# Patient Record
Sex: Male | Born: 1965 | Race: White | Hispanic: No | Marital: Married | Smoking: Never smoker
Health system: Southern US, Community
[De-identification: ages and names within clinical notes are randomized; demographics above are authoritative.]

## PROBLEM LIST (undated history)

## (undated) DIAGNOSIS — K5790 Diverticulosis of intestine, part unspecified, without perforation or abscess without bleeding: Secondary | ICD-10-CM

## (undated) DIAGNOSIS — K760 Fatty (change of) liver, not elsewhere classified: Secondary | ICD-10-CM

## (undated) DIAGNOSIS — F909 Attention-deficit hyperactivity disorder, unspecified type: Secondary | ICD-10-CM

## (undated) DIAGNOSIS — Z8619 Personal history of other infectious and parasitic diseases: Secondary | ICD-10-CM

## (undated) DIAGNOSIS — F419 Anxiety disorder, unspecified: Secondary | ICD-10-CM

## (undated) DIAGNOSIS — K219 Gastro-esophageal reflux disease without esophagitis: Secondary | ICD-10-CM

## (undated) DIAGNOSIS — S0532XA Ocular laceration without prolapse or loss of intraocular tissue, left eye, initial encounter: Secondary | ICD-10-CM

## (undated) DIAGNOSIS — E039 Hypothyroidism, unspecified: Secondary | ICD-10-CM

## (undated) HISTORY — PX: FL BARIUM SWALLOW (ARMC HX): HXRAD1289

## (undated) HISTORY — DX: Personal history of other infectious and parasitic diseases: Z86.19

## (undated) HISTORY — DX: Hypothyroidism, unspecified: E03.9

## (undated) HISTORY — DX: Gastro-esophageal reflux disease without esophagitis: K21.9

## (undated) HISTORY — PX: TONSILLECTOMY: SUR1361

## (undated) HISTORY — DX: Anxiety disorder, unspecified: F41.9

## (undated) HISTORY — DX: Attention-deficit hyperactivity disorder, unspecified type: F90.9

---
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
## 1988-04-13 HISTORY — PX: WISDOM TOOTH EXTRACTION: SHX21

## 1992-04-13 HISTORY — PX: LASIK: SHX215

## 2003-04-14 DIAGNOSIS — F909 Attention-deficit hyperactivity disorder, unspecified type: Secondary | ICD-10-CM

## 2003-04-14 HISTORY — DX: Attention-deficit hyperactivity disorder, unspecified type: F90.9

## 2010-04-13 DIAGNOSIS — E039 Hypothyroidism, unspecified: Secondary | ICD-10-CM

## 2010-04-13 HISTORY — DX: Hypothyroidism, unspecified: E03.9

## 2010-12-12 LAB — CBC
HGB: 16.4 g/dL
WBC: 6.8
platelet count: 194

## 2013-07-14 LAB — LIPID PANEL
Cholesterol, Total: 147
HDL: 39 mg/dL (ref 35–70)
LDL (calc): 87
TRIGLYCERIDES: 105

## 2013-07-14 LAB — COMPREHENSIVE METABOLIC PANEL
ALT: 33
AST: 27 U/L
Alkaline Phosphatase: 89 U/L
Bilirubin: 0.4
CREATININE: 1.04
Glucose: 86
POTASSIUM: 4.4 mmol/L
SODIUM: 139

## 2014-03-16 LAB — TSH: TSH: 1.57

## 2014-07-13 ENCOUNTER — Encounter: Payer: Self-pay | Admitting: Radiology

## 2014-07-13 ENCOUNTER — Encounter (INDEPENDENT_AMBULATORY_CARE_PROVIDER_SITE_OTHER): Payer: Self-pay

## 2014-07-13 ENCOUNTER — Ambulatory Visit (INDEPENDENT_AMBULATORY_CARE_PROVIDER_SITE_OTHER): Admitting: Family Medicine

## 2014-07-13 ENCOUNTER — Encounter: Payer: Self-pay | Admitting: Family Medicine

## 2014-07-13 VITALS — BP 130/86 | HR 68 | Temp 98.3°F | Ht 73.5 in | Wt 225.2 lb

## 2014-07-13 DIAGNOSIS — K219 Gastro-esophageal reflux disease without esophagitis: Secondary | ICD-10-CM | POA: Diagnosis not present

## 2014-07-13 DIAGNOSIS — F909 Attention-deficit hyperactivity disorder, unspecified type: Secondary | ICD-10-CM

## 2014-07-13 DIAGNOSIS — E039 Hypothyroidism, unspecified: Secondary | ICD-10-CM

## 2014-07-13 DIAGNOSIS — F419 Anxiety disorder, unspecified: Secondary | ICD-10-CM | POA: Diagnosis not present

## 2014-07-13 DIAGNOSIS — F988 Other specified behavioral and emotional disorders with onset usually occurring in childhood and adolescence: Secondary | ICD-10-CM | POA: Insufficient documentation

## 2014-07-13 MED ORDER — METHYLPHENIDATE HCL ER (OSM) 27 MG PO TBCR: 27.0000 mg | EXTENDED_RELEASE_TABLET | Freq: Every day | ORAL | Status: DC

## 2014-07-13 MED ORDER — LEVOTHYROXINE SODIUM 125 MCG PO TABS: 125.0000 ug | ORAL_TABLET | Freq: Every day | ORAL | Status: DC

## 2014-07-13 MED ORDER — FLUOXETINE HCL 20 MG PO CAPS: 20.0000 mg | ORAL_CAPSULE | Freq: Every day | ORAL | Status: DC

## 2014-07-13 MED ORDER — OMEPRAZOLE 20 MG PO CPDR: 20.0000 mg | DELAYED_RELEASE_CAPSULE | Freq: Every day | ORAL | Status: DC

## 2014-07-13 MED ORDER — FLUOXETINE HCL 10 MG PO CAPS: 10.0000 mg | ORAL_CAPSULE | Freq: Every day | ORAL | Status: DC

## 2014-07-13 NOTE — Assessment & Plan Note (Signed)
[facility] 10 yrs [facility], stable on generic Concerta. Refilled today.  Controlled substance agreement filled out today.  Did not do UDS today - will review latest one done 2 mo ago per patient (~05/2014) and if normal, next due will be 05/2015.

## 2014-07-13 NOTE — Patient Instructions (Addendum)
Good to meet you today. Meds refilled today. Return as needed for physical.

## 2014-07-13 NOTE — Assessment & Plan Note (Signed)
Refilled omeprazole 20mg  daily - well controlled on this dose.

## 2014-07-13 NOTE — Progress Notes (Signed)
Pre visit review using our clinic review tool, if applicable. No additional management support is needed unless otherwise documented below in the visit note. 

## 2014-07-13 NOTE — Assessment & Plan Note (Signed)
Per pt chronic dose with recent labs.  Check TSH next fasting labwork when returns for CPE.

## 2014-07-13 NOTE — Assessment & Plan Note (Signed)
Very stable on prozac 30mg  daily. Continue meds. Refilled today.

## 2014-07-13 NOTE — Progress Notes (Signed)
BP 130/86 mmHg  Pulse 68  Temp(Src) 98.3 F (36.8 C) (Oral)  Ht 6' 1.5" (1.867 m)  Wt 225 lb 4 oz (102.173 kg)  BMI 29.31 kg/m2   CC: new pt to establish care  Subjective:    Patient ID: Phillip Gomez, male    DOB: May 04, 1965, 49 y.o.   MRN: [id number]  HPI: Phillip Gomez is a 49 y.o. male presenting on 07/13/2014 for Establish Care   Prior saw [facility] Valentino Saxon [facility]). Pt already requested records sent here but they have not yet arrived.   Hypothyroidism - compliant with levothyroxine daily. Dx 2012. Last TSH was 6 wks ago, normal. No hyper or hypothyroid sxs.   ADD - on methylphenidate  CR daily. Dx 2005. Diagnosed by 2 mental health professionals at [facility] in [place]. Did not have testing done. Did have UDS done 2 mo ago by prior PCP - will await records.  Anxiety - on prozac  daily. Well controlled.   GERD - on omeprazole  daily. H/o HH. Controlled well on PPI. No breakthrough GERD sxs.   Preventative: Last 2012.  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 Interim medical history since our last visit reviewed. Allergies and medications reviewed and updated. No current outpatient prescriptions on file prior to visit.   No current facility-administered medications on file prior to visit.    Review of Systems Per HPI unless specifically indicated above     Objective:    BP 130/86 mmHg  Pulse 68  Temp(Src) 98.3 F (36.8 C) (Oral)  Ht 6' 1.5" (1.867 m)  Wt 225 lb 4 oz (102.173 kg)  BMI 29.31 kg/m2  Wt Readings from Last 3 Encounters:  07/13/14 225 lb 4 oz (102.173 kg)    Physical Exam  Constitutional: He is oriented to person, place, and time.  He appears well-developed and well-nourished. No distress.  HENT:  Head: Normocephalic and atraumatic.  Right Ear: Hearing, tympanic membrane, external ear and ear canal normal.  Left Ear: Hearing, tympanic membrane, external ear and ear canal normal.  Nose: Nose normal.  Mouth/Throat: Uvula is midline, oropharynx is clear and moist and mucous membranes are normal. No oropharyngeal exudate, posterior oropharyngeal edema or posterior oropharyngeal erythema.  Eyes: Conjunctivae and EOM are normal. Pupils are equal, round, and reactive to light. No scleral icterus.  Neck: Normal range of motion. Neck supple. No thyromegaly present.  Cardiovascular: Normal rate, regular rhythm, normal heart sounds and intact distal pulses.   No murmur heard. Pulses:      Radial pulses are 2+ on the right side, and 2+ on the left side.  Pulmonary/Chest: Effort normal and breath sounds normal. No respiratory distress. He has no wheezes. He has no rales.  Musculoskeletal: Normal range of motion. He exhibits no edema.  Lymphadenopathy:    He has no cervical adenopathy.  Neurological: He is alert and oriented to person, place, and time.  CN grossly intact, station and gait intact  Skin: Skin is warm and dry. No rash noted.  Psychiatric: He has a normal mood and affect. His behavior is normal. Judgment and thought content normal.  Nursing note and vitals reviewed.  No results found for this or any previous visit.  Assessment & Plan:   Problem List Items Addressed This Visit    Hypothyroidism - Primary    Per pt chronic dose with recent labs.  Check TSH next fasting labwork when returns for CPE.      Relevant Medications   levothyroxine (SYNTHROID, LEVOTHROID) tablet   GERD (gastroesophageal reflux disease)    Refilled omeprazole 20mg  daily - well controlled on this dose.      Relevant Medications   omeprazole (PRILOSEC) capsule   Anxiety    Very stable on prozac 30mg  daily. Continue meds. Refilled  today.      Relevant Medications   FLUoxetine (PROZAC) capsule   FLUoxetine (PROZAC) capsule   ADD (attention deficit disorder)    [facility] stable on generic Concerta. Refilled today.  Controlled substance agreement filled out today.  Did not do UDS today - will review latest one done 2 mo ago per patient (~05/2014) and if normal, next due will be 05/2015.          Follow up plan: Return as needed, for physical.

## 2014-08-15 ENCOUNTER — Other Ambulatory Visit: Payer: Self-pay

## 2014-08-15 NOTE — Telephone Encounter (Signed)
Pt left v/m requesting rx methylphenidate. Call when ready for pick up. Pt last seen 07/13/2014.

## 2014-08-16 MED ORDER — METHYLPHENIDATE HCL ER (OSM) 27 MG PO TBCR: 27.0000 mg | EXTENDED_RELEASE_TABLET | Freq: Every day | ORAL | Status: DC

## 2014-08-16 NOTE — Telephone Encounter (Signed)
Patient notified and Rx placed up front for pick up. 

## 2014-08-16 NOTE — Telephone Encounter (Signed)
Printed and in Kim's box 

## 2014-08-19 ENCOUNTER — Encounter: Payer: Self-pay | Admitting: Family Medicine

## 2014-08-22 ENCOUNTER — Encounter: Payer: Self-pay | Admitting: *Deleted

## 2014-09-12 ENCOUNTER — Other Ambulatory Visit: Payer: Self-pay

## 2014-09-12 NOTE — Telephone Encounter (Signed)
Pt left v/m requesting rx methylphenidate. Call when ready for pick up. Seen last 07/13/2014 and rx printed # 30 on 08/16/14.

## 2014-09-13 MED ORDER — METHYLPHENIDATE HCL ER (OSM) 27 MG PO TBCR: 27.0000 mg | EXTENDED_RELEASE_TABLET | Freq: Every day | ORAL | Status: DC

## 2014-09-13 NOTE — Telephone Encounter (Signed)
Patient notified and Rx placed up front for pick up. 

## 2014-09-13 NOTE — Telephone Encounter (Signed)
Printed and in Kim's box 

## 2014-10-04 ENCOUNTER — Other Ambulatory Visit: Payer: Self-pay | Admitting: *Deleted

## 2014-10-04 MED ORDER — FLUOXETINE HCL 20 MG PO CAPS: 20.0000 mg | ORAL_CAPSULE | Freq: Every day | ORAL | Status: DC

## 2014-10-04 MED ORDER — FLUOXETINE HCL 10 MG PO CAPS: 10.0000 mg | ORAL_CAPSULE | Freq: Every day | ORAL | Status: DC

## 2014-10-04 MED ORDER — OMEPRAZOLE 20 MG PO CPDR: 20.0000 mg | DELAYED_RELEASE_CAPSULE | Freq: Every day | ORAL | Status: DC

## 2014-10-04 MED ORDER — LEVOTHYROXINE SODIUM 125 MCG PO TABS: 125.0000 ug | ORAL_TABLET | Freq: Every day | ORAL | Status: DC

## 2014-10-17 ENCOUNTER — Other Ambulatory Visit: Payer: Self-pay

## 2014-10-17 NOTE — Telephone Encounter (Signed)
Pt request rx methylphenidate. Call when ready for pick up. Last printed # 30 on 09/13/14 and seen 07/13/14.

## 2014-10-18 MED ORDER — METHYLPHENIDATE HCL ER (OSM) 27 MG PO TBCR: 27.0000 mg | EXTENDED_RELEASE_TABLET | Freq: Every day | ORAL | Status: DC

## 2014-10-18 NOTE — Telephone Encounter (Signed)
Printed and in Kim's box 

## 2014-10-18 NOTE — Telephone Encounter (Signed)
Message left notifying patient and Rx placed up front for pick up. 

## 2014-11-13 ENCOUNTER — Other Ambulatory Visit: Payer: Self-pay

## 2014-11-13 NOTE — Telephone Encounter (Signed)
Pt left v/m requesting rx methylphenidate. Call when ready for pick up. rx last printed # 30 on 10/18/14. Last seen 07/13/14.

## 2014-11-14 MED ORDER — METHYLPHENIDATE HCL ER (OSM) 27 MG PO TBCR: 27.0000 mg | EXTENDED_RELEASE_TABLET | Freq: Every day | ORAL | Status: DC

## 2014-11-14 NOTE — Telephone Encounter (Signed)
Printed and in Kim's box 

## 2014-11-14 NOTE — Telephone Encounter (Signed)
Patient notified and Rx placed up front for pick up. 

## 2014-12-13 ENCOUNTER — Other Ambulatory Visit: Payer: Self-pay

## 2014-12-13 MED ORDER — METHYLPHENIDATE HCL ER (OSM) 27 MG PO TBCR: 27.0000 mg | EXTENDED_RELEASE_TABLET | Freq: Every day | ORAL | Status: DC

## 2014-12-13 NOTE — Telephone Encounter (Signed)
Patient notified and Rx placed up front for pick up. 

## 2014-12-13 NOTE — Telephone Encounter (Signed)
Printed and in Kim's box 

## 2014-12-13 NOTE — Telephone Encounter (Signed)
Pt left v/m requesting rx concerta. Call when ready for pick up. rx last printed # 30 on 11/14/2014.established care visit 07/13/14.

## 2015-01-08 ENCOUNTER — Other Ambulatory Visit: Payer: Self-pay | Admitting: *Deleted

## 2015-01-08 NOTE — Telephone Encounter (Signed)
Last OV to establish care on 07/13/14.  Rx last filled #30 12/13/14.

## 2015-01-10 MED ORDER — METHYLPHENIDATE HCL ER (OSM) 27 MG PO TBCR: 27.0000 mg | EXTENDED_RELEASE_TABLET | Freq: Every day | ORAL | Status: DC

## 2015-01-10 NOTE — Telephone Encounter (Signed)
Message left notifying patient and Rx placed up front for pick up. 

## 2015-01-10 NOTE — Telephone Encounter (Signed)
Printed and in Kim's box 

## 2015-02-07 ENCOUNTER — Ambulatory Visit (INDEPENDENT_AMBULATORY_CARE_PROVIDER_SITE_OTHER): Admitting: Family Medicine

## 2015-02-07 ENCOUNTER — Encounter: Payer: Self-pay | Admitting: Family Medicine

## 2015-02-07 VITALS — BP 112/80 | HR 75 | Temp 98.1°F | Wt 227.0 lb

## 2015-02-07 DIAGNOSIS — F419 Anxiety disorder, unspecified: Secondary | ICD-10-CM | POA: Diagnosis not present

## 2015-02-07 DIAGNOSIS — F909 Attention-deficit hyperactivity disorder, unspecified type: Secondary | ICD-10-CM

## 2015-02-07 DIAGNOSIS — K219 Gastro-esophageal reflux disease without esophagitis: Secondary | ICD-10-CM | POA: Diagnosis not present

## 2015-02-07 DIAGNOSIS — R5382 Chronic fatigue, unspecified: Secondary | ICD-10-CM | POA: Diagnosis not present

## 2015-02-07 DIAGNOSIS — E663 Overweight: Secondary | ICD-10-CM

## 2015-02-07 DIAGNOSIS — E039 Hypothyroidism, unspecified: Secondary | ICD-10-CM

## 2015-02-07 DIAGNOSIS — F988 Other specified behavioral and emotional disorders with onset usually occurring in childhood and adolescence: Secondary | ICD-10-CM

## 2015-02-07 MED ORDER — METHYLPHENIDATE HCL ER (OSM) 27 MG PO TBCR: 27.0000 mg | EXTENDED_RELEASE_TABLET | Freq: Every day | ORAL | Status: DC

## 2015-02-07 NOTE — Assessment & Plan Note (Addendum)
Refilled methylphenidate. Controlled substance agreement filled 07/2014. Pt states had normal UDS 05/2014. I did not receive UDS but received other labs - will need UDS next visit.

## 2015-02-07 NOTE — Assessment & Plan Note (Signed)
Chronic. Last check 03/2014 normal. Check TSH at fasting labs.

## 2015-02-07 NOTE — Progress Notes (Signed)
BP 112/80 mmHg  Pulse 75  Temp(Src) 98.1 F (36.7 C) (Oral)  Wt 227 lb (102.967 kg)  SpO2 98%   CC: fatigue  Subjective:    Patient ID: Phillip Gomez, male    DOB: 1965-09-13, 49 y.o.   MRN: [id number]  HPI: Phillip Gomez is a 49 y.o. male presenting on 02/07/2015 for Fatigue   Established care here 07/2014. Presents for f/u visit with wife today.   Endorses 6-8 mo fatigue , decreased libido, feels mood ok. Wife feels mood swings. Feels he sleeps well. Bedtime 9pm, easily falls asleep, wakes up feeling refreshed. Mild snoring, no apnea or PNDyspnea. No daytime somnolence. There has been some recent family stress but that is now better.  Drinks 2 cans mt dew daily.  Leaves house 5:30am, arrive at home 5pm. Owns business - furniture refinishing/repair/repainting.   Known hypothyroidism on levothyroxine daily. Dx 2012.   ADD - continues methylphenidate  CR daily. Dx 2005. Also on prozac  daily for anxiety.   GERD - long term on omeprazole  daily. Taking this medicine for last 3-4 yrs.   Activity - no regular activity.   Relevant past medical, surgical, family and social history reviewed and updated as indicated. Interim medical history since our last visit reviewed. Allergies and medications reviewed and updated. Current Outpatient Prescriptions on File Prior to Visit  Medication Sig  . FLUoxetine (PROZAC) 10 MG capsule Take 1 capsule (10 mg total) by mouth daily.  Marland Kitchen FLUoxetine (PROZAC) 20 MG capsule Take 1 capsule (20 mg total) by mouth daily.  Marland Kitchen levothyroxine (SYNTHROID, LEVOTHROID) 125 MCG tablet Take 1 tablet (125 mcg total) by mouth daily before breakfast.  . omeprazole (PRILOSEC) 20 MG capsule Take 1 capsule (20 mg total) by mouth daily.   No current facility-administered medications on file prior to visit.    Review of Systems Per HPI unless specifically indicated in ROS section     Objective:    BP 112/80 mmHg  Pulse 75  Temp(Src) 98.1 F (36.7  C) (Oral)  Wt 227 lb (102.967 kg)  SpO2 98%  Wt Readings from Last 3 Encounters:  02/07/15 227 lb (102.967 kg)  07/13/14 225 lb 4 oz (102.173 kg)   Body mass index is 29.54 kg/(m^2).  Physical Exam  Constitutional: He appears well-developed and well-nourished. No distress.  HENT:  Mouth/Throat: Oropharynx is clear and moist. No oropharyngeal exudate.  Eyes: Conjunctivae and EOM are normal. Pupils are equal, round, and reactive to light. No scleral icterus.  Neck: Normal range of motion. Neck supple. No thyromegaly present.  Cardiovascular: Normal rate, regular rhythm, normal heart sounds and intact distal pulses.   No murmur heard. Pulmonary/Chest: Effort normal and breath sounds normal. No respiratory distress. He has no wheezes. He has no rales.  Musculoskeletal: He exhibits no edema.  Skin: Skin is warm and dry. No rash noted.  Psychiatric: He has a normal mood and affect.  Nursing note and vitals reviewed.  Results for orders placed or performed in visit on 08/22/14  TSH  Result Value Ref Range   TSH 1.570   Comprehensive metabolic panel  Result Value Ref Range   Glucose 86    Creat 1.04    Sodium 139    Potassium 4.4 mmol/L   Bilirubin 0.4    Alkaline Phosphatase 89 U/L   AST 27 U/L   ALT 33   Lipid panel  Result Value Ref Range   Cholesterol, Total 147    Triglycerides 105  HDL 39 35 - 70 mg/dL   LDL (calc) 87   CBC  Result Value Ref Range   WBC 6.8    HGB 16.4 g/dL   platelet count 161194       Assessment & Plan:   Problem List Items Addressed This Visit    Hypothyroidism    Chronic. Last check 03/2014 normal. Check TSH at fasting labs.      Relevant Orders   TSH   GERD (gastroesophageal reflux disease)    3-4 yr h/o daily PPI - check for vitamin deficiencies      Relevant Orders   Vitamin B12   Vit D  25 hydroxy (rtn osteoporosis monitoring)   Chronic fatigue - Primary    Longstanding. Check for reversible causes when pt returns fasting. In  decreased libido, check testosterone levels as well. Doubt sleep issue, discussed importance of good nutrition (reviewed food and drink choices he regularly makes), recommended incorporate exercise into routine. If unrevealing etiology, discussed possible change in psychotropics.       Relevant Orders   Vitamin B12   Vit D  25 hydroxy (rtn osteoporosis monitoring)   Folate   Comprehensive metabolic panel   TSH   CBC with Differential/Platelet   Testosterone   Anxiety    On prozac for this, has been stable.      ADD (attention deficit disorder)    Refilled methylphenidate. Controlled substance agreement filled 07/2014. Pt states had normal UDS 05/2014. I did not receive UDS but received other labs - will need UDS next visit.       Other Visit Diagnoses    Overweight        Relevant Orders    Lipid panel    Comprehensive metabolic panel        Follow up plan: Return in about 6 months (around 08/08/2015), or as needed, for annual exam, prior fasting for blood work.

## 2015-02-07 NOTE — Assessment & Plan Note (Addendum)
3-4 yr h/o daily PPI - check for vitamin deficiencies

## 2015-02-07 NOTE — Assessment & Plan Note (Addendum)
Longstanding. Check for reversible causes when pt returns fasting. In decreased libido, check testosterone levels as well. Doubt sleep issue, discussed importance of good nutrition (reviewed food and drink choices he regularly makes), recommended incorporate exercise into routine. If unrevealing etiology, discussed possible change in psychotropics.

## 2015-02-07 NOTE — Progress Notes (Signed)
Pre visit review using our clinic review tool, if applicable. No additional management support is needed unless otherwise documented below in the visit note. 

## 2015-02-07 NOTE — Assessment & Plan Note (Signed)
On prozac for this, has been stable.

## 2015-02-07 NOTE — Patient Instructions (Addendum)
Return at your convenience fasting for lab work we will check for reversible causes of fatigue. Work on incorporating activity into your routine. Try to incoporate a fruit or vegetables with each meal.  Nice to see you today, call us with questions.

## 2015-02-14 ENCOUNTER — Other Ambulatory Visit (INDEPENDENT_AMBULATORY_CARE_PROVIDER_SITE_OTHER)

## 2015-02-14 DIAGNOSIS — K219 Gastro-esophageal reflux disease without esophagitis: Secondary | ICD-10-CM

## 2015-02-14 DIAGNOSIS — R5382 Chronic fatigue, unspecified: Secondary | ICD-10-CM | POA: Diagnosis not present

## 2015-02-14 DIAGNOSIS — E663 Overweight: Secondary | ICD-10-CM | POA: Diagnosis not present

## 2015-02-14 DIAGNOSIS — E039 Hypothyroidism, unspecified: Secondary | ICD-10-CM | POA: Diagnosis not present

## 2015-02-14 LAB — CBC WITH DIFFERENTIAL/PLATELET
BASOS ABS: 0 10*3/uL (ref 0.0–0.1)
Basophils Relative: 0.5 % (ref 0.0–3.0)
Eosinophils Absolute: 0.2 10*3/uL (ref 0.0–0.7)
Eosinophils Relative: 3.4 % (ref 0.0–5.0)
HEMATOCRIT: 48.3 % (ref 39.0–52.0)
HEMOGLOBIN: 16.6 g/dL (ref 13.0–17.0)
LYMPHS PCT: 25.5 % (ref 12.0–46.0)
Lymphs Abs: 1.5 10*3/uL (ref 0.7–4.0)
MCHC: 34.3 g/dL (ref 30.0–36.0)
MCV: 91.9 fl (ref 78.0–100.0)
MONOS PCT: 7.1 % (ref 3.0–12.0)
Monocytes Absolute: 0.4 10*3/uL (ref 0.1–1.0)
NEUTROS ABS: 3.9 10*3/uL (ref 1.4–7.7)
Neutrophils Relative %: 63.5 % (ref 43.0–77.0)
PLATELETS: 200 10*3/uL (ref 150.0–400.0)
RBC: 5.26 Mil/uL (ref 4.22–5.81)
RDW: 12.5 % (ref 11.5–15.5)
WBC: 6.1 10*3/uL (ref 4.0–10.5)

## 2015-02-14 LAB — LIPID PANEL
CHOL/HDL RATIO: 4
Cholesterol: 140 mg/dL (ref 0–200)
HDL: 35.3 mg/dL — AB (ref >39.00)
NONHDL: 104.26
TRIGLYCERIDES: 212 mg/dL — AB (ref 0.0–149.0)
VLDL: 42.4 mg/dL — ABNORMAL HIGH (ref 0.0–40.0)

## 2015-02-14 LAB — COMPREHENSIVE METABOLIC PANEL
ALK PHOS: 90 U/L (ref 39–117)
ALT: 39 U/L (ref 0–53)
AST: 28 U/L (ref 0–37)
Albumin: 4.2 g/dL (ref 3.5–5.2)
BUN: 21 mg/dL (ref 6–23)
CO2: 32 meq/L (ref 19–32)
Calcium: 9.7 mg/dL (ref 8.4–10.5)
Chloride: 101 mEq/L (ref 96–112)
Creatinine, Ser: 1.01 mg/dL (ref 0.40–1.50)
GFR: 83.6 mL/min (ref >60.00)
GLUCOSE: 97 mg/dL (ref 70–99)
POTASSIUM: 4.9 meq/L (ref 3.5–5.1)
Sodium: 138 mEq/L (ref 135–145)
Total Bilirubin: 0.7 mg/dL (ref 0.2–1.2)
Total Protein: 7.3 g/dL (ref 6.0–8.3)

## 2015-02-14 LAB — VITAMIN B12: Vitamin B-12: 435 pg/mL (ref 211–911)

## 2015-02-14 LAB — VITAMIN D 25 HYDROXY (VIT D DEFICIENCY, FRACTURES): VITD: 38.1 ng/mL (ref 30.00–100.00)

## 2015-02-14 LAB — FOLATE: Folate: >23.8 ng/mL (ref >5.9)

## 2015-02-14 LAB — TESTOSTERONE: Testosterone: 255.43 ng/dL — ABNORMAL LOW (ref 300.00–890.00)

## 2015-02-14 LAB — LDL CHOLESTEROL, DIRECT: Direct LDL: 81 mg/dL

## 2015-02-14 LAB — TSH: TSH: 5.6 u[IU]/mL — ABNORMAL HIGH (ref 0.35–4.50)

## 2015-02-19 ENCOUNTER — Other Ambulatory Visit: Payer: Self-pay | Admitting: Family Medicine

## 2015-02-19 DIAGNOSIS — E039 Hypothyroidism, unspecified: Secondary | ICD-10-CM

## 2015-02-19 DIAGNOSIS — E291 Testicular hypofunction: Secondary | ICD-10-CM

## 2015-02-19 MED ORDER — LEVOTHYROXINE SODIUM 137 MCG PO TABS: 137.0000 ug | ORAL_TABLET | Freq: Every day | ORAL | Status: DC

## 2015-02-28 ENCOUNTER — Other Ambulatory Visit

## 2015-03-15 ENCOUNTER — Other Ambulatory Visit: Payer: Self-pay

## 2015-03-15 MED ORDER — METHYLPHENIDATE HCL ER (OSM) 27 MG PO TBCR: 27.0000 mg | EXTENDED_RELEASE_TABLET | Freq: Every day | ORAL | Status: DC

## 2015-03-15 NOTE — Telephone Encounter (Signed)
Pt left v/m requesting methylphenidate rx. Call when ready for pickup. Pt last seen and rx last printed # 30 on 02/07/15.

## 2015-03-15 NOTE — Telephone Encounter (Signed)
Printed and in Kim's box 

## 2015-03-18 NOTE — Telephone Encounter (Signed)
Message left advising patient and Rx placed up front for pick up. 

## 2015-04-03 ENCOUNTER — Other Ambulatory Visit (INDEPENDENT_AMBULATORY_CARE_PROVIDER_SITE_OTHER)

## 2015-04-03 DIAGNOSIS — E039 Hypothyroidism, unspecified: Secondary | ICD-10-CM

## 2015-04-03 DIAGNOSIS — E291 Testicular hypofunction: Secondary | ICD-10-CM

## 2015-04-03 LAB — TSH: TSH: 1.6 u[IU]/mL (ref 0.35–4.50)

## 2015-04-03 LAB — T4, FREE: FREE T4: 0.88 ng/dL (ref 0.60–1.60)

## 2015-04-04 LAB — TESTOSTERONE, FREE, TOTAL, SHBG
Sex Hormone Binding: 29 nmol/L (ref 10–50)
TESTOSTERONE-% FREE: 2.1 % (ref 1.6–2.9)
TESTOSTERONE: 267 ng/dL — AB (ref 300–890)
Testosterone, Free: 55.9 pg/mL (ref 47.0–244.0)

## 2015-04-04 LAB — FSH/LH
FSH: 4.1 m[IU]/mL (ref 1.4–18.1)
LH: 3 m[IU]/mL (ref 1.5–9.3)

## 2015-04-11 ENCOUNTER — Other Ambulatory Visit: Payer: Self-pay

## 2015-04-11 NOTE — Telephone Encounter (Signed)
Pt left v/m requesting rx methylphenidate CR. Call when ready for pick up . Pt has 7 days of med left. Last printed # 30 on 03/15/15 and last seen 02/07/15.

## 2015-04-12 MED ORDER — METHYLPHENIDATE HCL ER (OSM) 27 MG PO TBCR: 27.0000 mg | EXTENDED_RELEASE_TABLET | Freq: Every day | ORAL | Status: DC

## 2015-04-12 NOTE — Telephone Encounter (Signed)
Rx left in front office for pick up and pt is aware  

## 2015-04-12 NOTE — Telephone Encounter (Signed)
Printed and in Kim's box 

## 2015-05-13 ENCOUNTER — Other Ambulatory Visit: Payer: Self-pay | Admitting: Family Medicine

## 2015-05-15 ENCOUNTER — Other Ambulatory Visit: Payer: Self-pay

## 2015-05-15 MED ORDER — METHYLPHENIDATE HCL ER (OSM) 27 MG PO TBCR: 27.0000 mg | EXTENDED_RELEASE_TABLET | Freq: Every day | ORAL | Status: DC

## 2015-05-15 NOTE — Telephone Encounter (Signed)
Printed and in Kim's box 

## 2015-05-15 NOTE — Telephone Encounter (Signed)
Pt left v/m requesting rx methylphenidate. Call when ready for pick up. pts wife will be in office late today and if ready wife will pick up; otherwise pt will pick up after rx ready for pick up. Last printed # 30 on 04/12/15. Last seen 02/07/15.

## 2015-05-16 NOTE — Telephone Encounter (Signed)
Patient given Rx per Bonita Quin T.

## 2015-06-13 ENCOUNTER — Other Ambulatory Visit: Payer: Self-pay

## 2015-06-13 MED ORDER — METHYLPHENIDATE HCL ER (OSM) 27 MG PO TBCR: 27.0000 mg | EXTENDED_RELEASE_TABLET | Freq: Every day | ORAL | Status: DC

## 2015-06-13 NOTE — Telephone Encounter (Signed)
Printed and in Kim's box 

## 2015-06-13 NOTE — Telephone Encounter (Signed)
Pt left v/m requesting rx methylphenidate CR. Call when ready for pick up. Last printed # 30 on 05/15/15; last seen 02/07/15.

## 2015-06-14 NOTE — Telephone Encounter (Signed)
Patient notified and Rx placed up front for pick up. 

## 2015-07-11 ENCOUNTER — Other Ambulatory Visit: Payer: Self-pay | Admitting: *Deleted

## 2015-07-11 MED ORDER — METHYLPHENIDATE HCL ER (OSM) 27 MG PO TBCR: 27.0000 mg | EXTENDED_RELEASE_TABLET | Freq: Every day | ORAL | Status: DC

## 2015-07-11 NOTE — Telephone Encounter (Signed)
Printed and in Kim's box 

## 2015-07-11 NOTE — Telephone Encounter (Signed)
Pt left voicemail at Triage requesting refill of med. Last refilled on 06/13/15 #30 with 0 refills

## 2015-07-12 NOTE — Telephone Encounter (Signed)
Patient notified and Rx placed up front for pick up. 

## 2015-07-29 ENCOUNTER — Other Ambulatory Visit: Payer: Self-pay | Admitting: Family Medicine

## 2015-08-11 ENCOUNTER — Other Ambulatory Visit: Payer: Self-pay | Admitting: Family Medicine

## 2015-08-12 ENCOUNTER — Other Ambulatory Visit: Payer: Self-pay

## 2015-08-12 MED ORDER — METHYLPHENIDATE HCL ER (OSM) 27 MG PO TBCR
27.0000 mg | EXTENDED_RELEASE_TABLET | Freq: Every day | ORAL | Status: DC
Start: 2015-08-12 — End: 2015-09-10

## 2015-08-12 NOTE — Telephone Encounter (Signed)
Pt left v/m requesting rx methylphenidate. Call when ready for pick up. Last printed # 30 on 07/11/15; last seen 02/07/15.

## 2015-08-12 NOTE — Telephone Encounter (Signed)
Printed and in Kim's box 

## 2015-08-12 NOTE — Telephone Encounter (Signed)
Pt's last visit 02/07/15, last refill 10/07/14,. Pt hasn't scheduled a 6 month f/u as recommended. Ok to refill?

## 2015-08-13 NOTE — Telephone Encounter (Signed)
Patient notified and Rx placed up front for pick up. 

## 2015-09-05 ENCOUNTER — Ambulatory Visit (INDEPENDENT_AMBULATORY_CARE_PROVIDER_SITE_OTHER): Admitting: Podiatry

## 2015-09-05 ENCOUNTER — Ambulatory Visit (INDEPENDENT_AMBULATORY_CARE_PROVIDER_SITE_OTHER)

## 2015-09-05 ENCOUNTER — Encounter: Payer: Self-pay | Admitting: Podiatry

## 2015-09-05 VITALS — BP 126/87 | HR 63 | Resp 18

## 2015-09-05 DIAGNOSIS — R52 Pain, unspecified: Secondary | ICD-10-CM

## 2015-09-05 MED ORDER — MELOXICAM 15 MG PO TABS: 15.0000 mg | ORAL_TABLET | Freq: Every day | ORAL | Status: DC

## 2015-09-05 NOTE — Patient Instructions (Signed)

## 2015-09-05 NOTE — Progress Notes (Signed)
Subjective:     Patient ID: Phillip CapersDaniel Gomez, male   DOB: 06/05/1965, 50 y.o.   MRN: [id number]  HPI 50 year old male presents the office if concerns arise heel pain which is been ongoing for about 2 months is worse in the morning when he first gets up and sitting down for some time and stands. Denies any recent injury trauma. No swelling or redness. No tenderness. Describes as a throbbing sensation. No previous treatment other than OTC inserts for cushion. No other complaints.  Review of Systems  All other systems reviewed and are negative.      Objective:   Physical Exam General: AAO x3, NAD  Dermatological: Skin is warm, dry and supple bilateral. Nails x 10 are well manicured; remaining integument appears unremarkable at this time. There are no open sores, no preulcerative lesions, no rash or signs of infection present.  Vascular: Dorsalis Pedis artery and Posterior Tibial artery pedal pulses are 2/4 bilateral with immedate capillary fill time. Pedal hair growth present. No varicosities and no lower extremity edema present bilateral. There is no pain with calf compression, swelling, warmth, erythema.   Neruologic: Grossly intact via light touch bilateral. Vibratory intact via tuning fork bilateral. Protective threshold with Semmes Wienstein monofilament intact to all pedal sites bilateral. Patellar and Achilles deep tendon reflexes 2+ bilateral. No Babinski or clonus noted bilateral.   Musculoskeletal: Tenderness to palpation along the plantar medial tubercle of the calcaneus at the insertion of plantar fascia on the right foot. There is no pain along the course of the plantar fascia within the arch of the foot. Plantar fascia appears to be intact. There is no pain with lateral compression of the calcaneus or pain with vibratory sensation. There is no pain along the course or insertion of the achilles tendon. No other areas of tenderness to bilateral lower extremities. MMT 5/5, ROM WNL.   Gait:  Unassisted, Nonantalgic.      Assessment:     50 year old male with right heel pain; plantar fasciitis.     Plan:     -Treatment options discussed including all alternatives, risks, and complications -Etiology of symptoms were discussed -X-rays were obtained and reviewed with the patient. No evidence of acute fracture or stress fracture. -Patient elects to proceed with steroid injection into the right heel. Under sterile skin preparation, a total of 2.5cc of kenalog 10, 0.5% Marcaine plain, and 2% lidocaine plain were infiltrated into the symptomatic area without complication. A band-aid was applied. Patient tolerated the injection well without complication. Post-injection care with discussed with the patient. Discussed with the patient to ice the area over the next couple of days to help prevent a steroid flare.  -Plantar fascial brace -Prescribed mobic. Discussed side effects of the medication and directed to stop if any are to occur and call the office.  -Stretching -Icing -Shoegear modifications (has OTC cushion inserts but no support) -Follow-up in 3 weeks or sooner if any problems arise. In the meantime, encouraged to call the office with any questions, concerns, change in symptoms.   Ovid CurdMatthew Wagoner, DPM

## 2015-09-10 ENCOUNTER — Other Ambulatory Visit: Payer: Self-pay

## 2015-09-10 NOTE — Telephone Encounter (Signed)
Pt left v/m requesting rx methylphenidate. Call when ready for pick up. Last printed # 30 on 08/12/15 and pt seen 02/07/15.

## 2015-09-11 MED ORDER — METHYLPHENIDATE HCL ER (OSM) 27 MG PO TBCR: 27.0000 mg | EXTENDED_RELEASE_TABLET | Freq: Every day | ORAL | Status: DC

## 2015-09-11 NOTE — Telephone Encounter (Signed)
Printed and in Kism' box. 

## 2015-09-11 NOTE — Telephone Encounter (Signed)
Patient notified and Rx placed up front for pick up. 

## 2015-09-26 ENCOUNTER — Encounter: Payer: Self-pay | Admitting: Podiatry

## 2015-09-26 ENCOUNTER — Ambulatory Visit (INDEPENDENT_AMBULATORY_CARE_PROVIDER_SITE_OTHER): Admitting: Podiatry

## 2015-09-26 DIAGNOSIS — M722 Plantar fascial fibromatosis: Secondary | ICD-10-CM

## 2015-09-29 NOTE — Progress Notes (Signed)
Patient ID: Darryll CapersDaniel Gulden, male   DOB: 03/23/1966, 50 y.o.   MRN: [id number]  Subjective: 50 year old male presents the office today for follow-up evaluation of right heel pain, plantar fasciitis. He states that he has had some relief since last appointment he believes is marked from the brace and the stretching icing. He is unsure if the shot helped too much. Although he has much better he still gets some pain to his heel mostly in the morning or after sitting down for some time. Denies any systemic complaints such as fevers, chills, nausea, vomiting. No acute changes since last appointment, and no other complaints at this time.   Objective: AAO x3, NAD DP/PT pulses palpable bilaterally, CRT less than 3 seconds There is decreased but continued tenderness to palpation along the plantar medial tubercle of the calcaneus at the insertion of plantar fascia on the right foot. There is no pain along the course of the plantar fascia within the arch of the foot. Plantar fascia appears to be intact. There is no pain with lateral compression of the calcaneus or pain with vibratory sensation. There is no pain along the course or insertion of the achilles tendon. No other areas of tenderness to bilateral lower extremities. No areas of pinpoint bony tenderness or pain with vibratory sensation. MMT 5/5, ROM WNL. Equinus is present. No edema, erythema, increase in warmth to bilateral lower extremities.  No open lesions or pre-ulcerative lesions.  No pain with calf compression, swelling, warmth, erythema  Assessment: 50 year old male right heel pain, likely plantar fasciitis which is resolving  Plan: -All treatment options discussed with the patient including all alternatives, risks, complications.  -Patient elects to proceed with steroid injection into the right heel. Under sterile skin preparation, a total of 2.5cc of kenalog 10, 0.5% Marcaine plain, and 2% lidocaine plain were infiltrated into the symptomatic area  without complication. A band-aid was applied. Patient tolerated the injection well without complication. Post-injection care with discussed with the patient. Discussed with the patient to ice the area over the next couple of days to help prevent a steroid flare.  -Dispensed night splint -Continue stretching, icing. Continue plantar fascial brace. Again discussed with him shoe gear modifications and orthotics. -Meloxicam as needed. -F/U 3 months -Patient encouraged to call the office with any questions, concerns, change in symptoms.   Ovid CurdMatthew Amaury Kuzel, DPM

## 2015-10-07 ENCOUNTER — Other Ambulatory Visit: Payer: Self-pay

## 2015-10-07 NOTE — Telephone Encounter (Signed)
Pt left v/m requesting rx methylphenidate. Call when ready for pick up. Last printed # 30 on 09/11/15; pt last seen 02/07/15.

## 2015-10-08 MED ORDER — METHYLPHENIDATE HCL ER (OSM) 27 MG PO TBCR: 27.0000 mg | EXTENDED_RELEASE_TABLET | Freq: Every day | ORAL | Status: DC

## 2015-10-08 NOTE — Telephone Encounter (Signed)
Pt notified Rx ready for pickup 

## 2015-10-08 NOTE — Telephone Encounter (Addendum)
Printed and in Kim's box 

## 2015-10-24 ENCOUNTER — Encounter: Payer: Self-pay | Admitting: Podiatry

## 2015-10-24 ENCOUNTER — Ambulatory Visit (INDEPENDENT_AMBULATORY_CARE_PROVIDER_SITE_OTHER): Admitting: Podiatry

## 2015-10-24 DIAGNOSIS — M722 Plantar fascial fibromatosis: Secondary | ICD-10-CM

## 2015-10-27 ENCOUNTER — Other Ambulatory Visit: Payer: Self-pay | Admitting: Family Medicine

## 2015-10-27 DIAGNOSIS — M722 Plantar fascial fibromatosis: Secondary | ICD-10-CM | POA: Insufficient documentation

## 2015-10-27 NOTE — Progress Notes (Signed)
Patient ID: Phillip CapersDaniel Tineo, male   DOB: 03/30/1966, 50 y.o.   MRN: [id number]  Subjective: 50 year old male presents the office for follow preparation of right heel pain, plantar fasciitis. He does that that overall his pain is much improved compared to what it was. He gets some occasional tenderness of the heel that he is doing better. He did get [facility] that he brought today that he let us see how to wear them. He has been stretching icing as well. Denies any systemic complaints such as fevers, chills, nausea, vomiting. No acute changes since last appointment, and no other complaints at this time.   Objective: AAO x3, NAD DP/PT pulses palpable bilaterally, CRT less than 3 seconds There is minimal tenderness to palpation along the plantar medial tubercle of the calcaneus at the insertion of plantar fascia on the right foot. There is no pain along the course of the plantar fascia within the arch of the foot. Plantar fascia appears to be intact. There is no pain with lateral compression of the calcaneus or pain with vibratory sensation. There is no pain along the course or insertion of the achilles tendon. No other areas of tenderness to bilateral lower extremities. No areas of pinpoint bony tenderness or pain with vibratory sensation. MMT 5/5, ROM WNL. No edema, erythema, increase in warmth to bilateral lower extremities.  No open lesions or pre-ulcerative lesions.  No pain with calf compression, swelling, warmth, erythema  Assessment: Resolving right heel pain, plantar fasciitis  Plan: -All treatment options discussed with the patient including all alternatives, risks, complications.  -Discussed the third steroid injection however his pain is much improved and he wishes to hold off. -Continue stretching, icing exercises daily as well as anti-inflammatories as needed. He did bring his over-the-counter inserts which the [facility] provided I discussed with him break in instructions to wear  them. Also discussed shoegear modifications. -Follow-up in 4 weeks if symptoms continue or sooner if any issues are to arise. -Patient encouraged to call the office with any questions, concerns, change in symptoms.   Ovid CurdMatthew Wagoner, DPM

## 2015-11-11 ENCOUNTER — Other Ambulatory Visit: Payer: Self-pay

## 2015-11-11 ENCOUNTER — Other Ambulatory Visit: Payer: Self-pay | Admitting: *Deleted

## 2015-11-11 MED ORDER — FLUOXETINE HCL 10 MG PO CAPS: 10.0000 mg | ORAL_CAPSULE | Freq: Every day | ORAL | 0 refills | Status: DC

## 2015-11-11 MED ORDER — FLUOXETINE HCL 20 MG PO CAPS: 20.0000 mg | ORAL_CAPSULE | Freq: Every day | ORAL | 0 refills | Status: DC

## 2015-11-11 MED ORDER — METHYLPHENIDATE HCL ER (OSM) 27 MG PO TBCR: 27.0000 mg | EXTENDED_RELEASE_TABLET | Freq: Every day | ORAL | 0 refills | Status: DC

## 2015-11-11 NOTE — Telephone Encounter (Signed)
Patient notified and Rx placed up front for pick up. 

## 2015-11-11 NOTE — Telephone Encounter (Signed)
Printed and in Kim's box 

## 2015-11-11 NOTE — Telephone Encounter (Signed)
Pt left v/m requesting rx concerta. Call when ready for pick up. Last printed # 30 on 10/08/15; last seen 02/07/15.

## 2015-11-21 ENCOUNTER — Ambulatory Visit: Admitting: Podiatry

## 2015-11-26 ENCOUNTER — Telehealth: Payer: Self-pay

## 2015-11-26 NOTE — Telephone Encounter (Signed)
Pt request refill thyroid med; [facility]; pt will ck with pharmacy and I advised pt needs to schedule annual exam with labs prior. Pt will cb when he checks his calendar.

## 2015-12-12 ENCOUNTER — Other Ambulatory Visit: Payer: Self-pay

## 2015-12-12 NOTE — Telephone Encounter (Signed)
Pt left v/m requesting to pick up  rx methylphenidate. Call when ready for pick up. Last printed # 30 on 11/11/15; pt last seen 02/07/15.

## 2015-12-13 MED ORDER — METHYLPHENIDATE HCL ER (OSM) 27 MG PO TBCR: 27.0000 mg | EXTENDED_RELEASE_TABLET | Freq: Every day | ORAL | 0 refills | Status: DC

## 2015-12-13 NOTE — Telephone Encounter (Signed)
Printed and in Kim's box 

## 2015-12-13 NOTE — Telephone Encounter (Signed)
Advised pt rx was up front ready for pickup 

## 2015-12-27 ENCOUNTER — Other Ambulatory Visit: Payer: Self-pay

## 2015-12-27 MED ORDER — MELOXICAM 15 MG PO TABS: 15.0000 mg | ORAL_TABLET | Freq: Every day | ORAL | 1 refills | Status: DC

## 2016-01-16 ENCOUNTER — Other Ambulatory Visit: Payer: Self-pay

## 2016-01-16 ENCOUNTER — Encounter: Payer: Self-pay | Admitting: Podiatry

## 2016-01-16 ENCOUNTER — Ambulatory Visit (INDEPENDENT_AMBULATORY_CARE_PROVIDER_SITE_OTHER): Admitting: Podiatry

## 2016-01-16 DIAGNOSIS — M722 Plantar fascial fibromatosis: Secondary | ICD-10-CM | POA: Diagnosis not present

## 2016-01-16 MED ORDER — METHYLPHENIDATE HCL ER (OSM) 27 MG PO TBCR: 27.0000 mg | EXTENDED_RELEASE_TABLET | Freq: Every day | ORAL | 0 refills | Status: DC

## 2016-01-16 NOTE — Telephone Encounter (Signed)
Did not receive prior request. Printed and in Kims' box. Will need f/u visit as due.

## 2016-01-16 NOTE — Telephone Encounter (Signed)
Pt left v/m; pt called in refill request for methylphenidate on 01/13/16; I was not in office that day and do not see a refill request; last printed #30 on 12/13/15 and pt last seen 02/07/15; no future appt scheduled.Please advise.

## 2016-01-17 NOTE — Telephone Encounter (Signed)
Patient notified and Rx placed up front for pick up. He will schedule CPE when he picks up Rx.

## 2016-01-23 NOTE — Progress Notes (Signed)
Patient ID: Phillip Gomez, male   DOB: 06/10/1965, 50 y.o.   MRN: [id number]  Subjective: 50 year old male presents the office for follow preparation of right heel pain, plantar fasciitis. Since last appointment he has started to have recurrence of pain. He is asking for refill of meloxicam. He has been stretching, icing his been using inserts inside of his shoes. Denies any numbness or tingling and the pain does not wake him up at night. Denies any systemic complaints such as fevers, chills, nausea, vomiting. No acute changes since last appointment, and no other complaints at this time.   Objective: AAO x3, NAD DP/PT pulses palpable bilaterally, CRT less than 3 seconds There is  increase in tenderness to palpation along the plantar medial tubercle of the calcaneus at the insertion of plantar fascia on the right foot compared to last appointment. There is no pain along the course of the plantar fascia within the arch of the foot. Plantar fascia appears to be intact. There is no pain with lateral compression of the calcaneus or pain with vibratory sensation. There is no pain along the course or insertion of the achilles tendon. No other areas of tenderness to bilateral lower extremities. No areas of pinpoint bony tenderness or pain with vibratory sensation. MMT 5/5, ROM WNL. No edema, erythema, increase in warmth to bilateral lower extremities.  No open lesions or pre-ulcerative lesions.  No pain with calf compression, swelling, warmth, erythema  Assessment: Recurrent right heel pain, plantar fasciitis  Plan: -All treatment options discussed with the patient including all alternatives, risks, complications.  -Patient elects to proceed with steroid injection into the right heel. Under sterile skin preparation, a total of 2.5cc of kenalog 10, 0.5% Marcaine plain, and 2% lidocaine plain were infiltrated into the symptomatic area without complication. A band-aid was applied. Patient tolerated the injection  well without complication. Post-injection care with discussed with the patient. Discussed with the patient to ice the area over the next couple of days to help prevent a steroid flare.  -Prescribed mobic. Discussed side effects of the medication and directed to stop if any are to occur and call the office.  -Continue stretching, icing exercises daily -Continue orthotics as well as supportive shoe gear -Follow-up as scheduled or sooner if any problems arise. In the meantime, encouraged to call the office with any questions, concerns, change in symptoms.    Phillip Gomez, Phillip Gomez

## 2016-02-13 ENCOUNTER — Other Ambulatory Visit: Payer: Self-pay

## 2016-02-13 ENCOUNTER — Ambulatory Visit: Admitting: Podiatry

## 2016-02-13 NOTE — Telephone Encounter (Signed)
Pt left /vm requesting refill methylphenidate. Call when ready for pick up. Pt would like to pick up on 02/14/16. Last printed # 30 on 01/16/16. Last seen 02/07/16. Please advise.

## 2016-02-14 MED ORDER — METHYLPHENIDATE HCL ER (OSM) 27 MG PO TBCR: 27.0000 mg | EXTENDED_RELEASE_TABLET | Freq: Every day | ORAL | 0 refills | Status: DC

## 2016-02-14 NOTE — Telephone Encounter (Signed)
Patient said he has enough medication for Saturday and Sunday.  Patient is asking to be called when rx is ready for pick up. Patient would like to pick up rx today. Please call patient at [phone].

## 2016-02-14 NOTE — Telephone Encounter (Signed)
Would see if provider in office willing to fill. Will route to Dr D. thanks

## 2016-02-14 NOTE — Telephone Encounter (Signed)
Patient advised.  Rx left at front desk for pick up. 

## 2016-02-14 NOTE — Telephone Encounter (Signed)
Printed.  Thanks.  

## 2016-02-25 ENCOUNTER — Other Ambulatory Visit: Payer: Self-pay | Admitting: Family Medicine

## 2016-03-11 ENCOUNTER — Other Ambulatory Visit: Payer: Self-pay

## 2016-03-11 MED ORDER — METHYLPHENIDATE HCL ER (OSM) 27 MG PO TBCR: 27.0000 mg | EXTENDED_RELEASE_TABLET | Freq: Every day | ORAL | 0 refills | Status: DC

## 2016-03-11 NOTE — Telephone Encounter (Signed)
Printed and in [facility]. plz schedule CPE as due.

## 2016-03-11 NOTE — Telephone Encounter (Signed)
Patient notified and will schedule appt when he picks up Rx. Rx placed up front for pick up.

## 2016-03-11 NOTE — Telephone Encounter (Signed)
Pt left v/m requesting rx methylphenidate. Call when ready for pick up. Last printed # 30 on 02/14/16. Last seen 02/07/15; no future appt scheduled.Please advise.

## 2016-04-03 ENCOUNTER — Other Ambulatory Visit: Payer: Self-pay | Admitting: Family Medicine

## 2016-04-03 NOTE — Telephone Encounter (Signed)
Pt request refill fluoxetine 10 mg and 20 mg. Last refilled each # 90 on 11/11/15. Pt has CPX scheduled on 04/22/16. Refilled per protocol x 1 and will update refills at appt.

## 2016-04-13 ENCOUNTER — Other Ambulatory Visit: Payer: Self-pay | Admitting: Family Medicine

## 2016-04-13 DIAGNOSIS — K760 Fatty (change of) liver, not elsewhere classified: Secondary | ICD-10-CM

## 2016-04-13 DIAGNOSIS — E039 Hypothyroidism, unspecified: Secondary | ICD-10-CM

## 2016-04-13 DIAGNOSIS — E785 Hyperlipidemia, unspecified: Secondary | ICD-10-CM | POA: Insufficient documentation

## 2016-04-13 DIAGNOSIS — K5790 Diverticulosis of intestine, part unspecified, without perforation or abscess without bleeding: Secondary | ICD-10-CM

## 2016-04-13 DIAGNOSIS — E291 Testicular hypofunction: Secondary | ICD-10-CM

## 2016-04-13 HISTORY — DX: Fatty (change of) liver, not elsewhere classified: K76.0

## 2016-04-13 HISTORY — DX: Diverticulosis of intestine, part unspecified, without perforation or abscess without bleeding: K57.90

## 2016-04-14 ENCOUNTER — Other Ambulatory Visit: Payer: Self-pay | Admitting: Family Medicine

## 2016-04-14 MED ORDER — METHYLPHENIDATE HCL ER (OSM) 27 MG PO TBCR: 27.0000 mg | EXTENDED_RELEASE_TABLET | Freq: Every day | ORAL | 0 refills | Status: DC

## 2016-04-14 NOTE — Telephone Encounter (Signed)
Last filled 03-11-16 #30 Last OV 02-07-15 Next OV 04-22-16

## 2016-04-14 NOTE — Telephone Encounter (Signed)
Printed and in Kim's box 

## 2016-04-14 NOTE — Telephone Encounter (Signed)
Pt called to request a refill of his methylphenidate.  He will come and pick it up when the rx is ready.  Please call him and let him know.   Thanks,

## 2016-04-15 ENCOUNTER — Other Ambulatory Visit (INDEPENDENT_AMBULATORY_CARE_PROVIDER_SITE_OTHER)

## 2016-04-15 DIAGNOSIS — E291 Testicular hypofunction: Secondary | ICD-10-CM

## 2016-04-15 DIAGNOSIS — E785 Hyperlipidemia, unspecified: Secondary | ICD-10-CM | POA: Diagnosis not present

## 2016-04-15 DIAGNOSIS — E039 Hypothyroidism, unspecified: Secondary | ICD-10-CM

## 2016-04-15 NOTE — Telephone Encounter (Signed)
Patient notified and Rx placed up front for pick up. 

## 2016-04-15 NOTE — Addendum Note (Signed)
Addended by: Baldomero LamyHAVERS, NATASHA C on: 04/15/2016 03:52 PM   Modules accepted: Orders

## 2016-04-17 LAB — CBC WITH DIFFERENTIAL/PLATELET
BASOS: 1 %
Basophils Absolute: 0 10*3/uL (ref 0.0–0.2)
EOS (ABSOLUTE): 0.2 10*3/uL (ref 0.0–0.4)
EOS: 3 %
HEMOGLOBIN: 16.6 g/dL (ref 13.0–17.7)
Hematocrit: 48 % (ref 37.5–51.0)
IMMATURE GRANULOCYTES: 1 %
Immature Grans (Abs): 0.1 10*3/uL (ref 0.0–0.1)
Lymphocytes Absolute: 1.6 10*3/uL (ref 0.7–3.1)
Lymphs: 24 %
MCH: 31.6 pg (ref 26.6–33.0)
MCHC: 34.6 g/dL (ref 31.5–35.7)
MCV: 91 fL (ref 79–97)
MONOCYTES: 6 %
MONOS ABS: 0.4 10*3/uL (ref 0.1–0.9)
NEUTROS PCT: 65 %
Neutrophils Absolute: 4.6 10*3/uL (ref 1.4–7.0)
Platelets: 234 10*3/uL (ref 150–379)
RBC: 5.26 x10E6/uL (ref 4.14–5.80)
RDW: 13.3 % (ref 12.3–15.4)
WBC: 7 10*3/uL (ref 3.4–10.8)

## 2016-04-17 LAB — BASIC METABOLIC PANEL
BUN/Creatinine Ratio: 13 (ref 9–20)
BUN: 15 mg/dL (ref 6–24)
CO2: 24 mmol/L (ref 18–29)
CREATININE: 1.19 mg/dL (ref 0.76–1.27)
Calcium: 9.3 mg/dL (ref 8.7–10.2)
Chloride: 99 mmol/L (ref 96–106)
GFR calc Af Amer: 82 mL/min/{1.73_m2} (ref >59)
GFR, EST NON AFRICAN AMERICAN: 71 mL/min/{1.73_m2} (ref >59)
Glucose: 77 mg/dL (ref 65–99)
Potassium: 4.6 mmol/L (ref 3.5–5.2)
Sodium: 142 mmol/L (ref 134–144)

## 2016-04-17 LAB — LIPID PANEL
CHOLESTEROL TOTAL: 158 mg/dL (ref 100–199)
Chol/HDL Ratio: 4.9 ratio units (ref 0.0–5.0)
HDL: 32 mg/dL — ABNORMAL LOW (ref >39)
LDL CALC: 50 mg/dL (ref 0–99)
TRIGLYCERIDES: 382 mg/dL — AB (ref 0–149)
VLDL Cholesterol Cal: 76 mg/dL — ABNORMAL HIGH (ref 5–40)

## 2016-04-17 LAB — TESTOSTERONE,FREE AND TOTAL
Testosterone, Free: 8 pg/mL (ref 6.8–21.5)
Testosterone: 172 ng/dL — ABNORMAL LOW (ref 264–916)

## 2016-04-17 LAB — HEPATIC FUNCTION PANEL
ALBUMIN: 4.4 g/dL (ref 3.5–5.5)
ALK PHOS: 98 IU/L (ref 39–117)
ALT: 38 IU/L (ref 0–44)
AST: 28 IU/L (ref 0–40)
BILIRUBIN TOTAL: 0.4 mg/dL (ref 0.0–1.2)
BILIRUBIN, DIRECT: 0.13 mg/dL (ref 0.00–0.40)
Total Protein: 7.2 g/dL (ref 6.0–8.5)

## 2016-04-17 LAB — TSH: TSH: 3.46 u[IU]/mL (ref 0.450–4.500)

## 2016-04-22 ENCOUNTER — Encounter: Payer: Self-pay | Admitting: Radiology

## 2016-04-22 ENCOUNTER — Encounter: Payer: Self-pay | Admitting: Family Medicine

## 2016-04-22 ENCOUNTER — Ambulatory Visit (INDEPENDENT_AMBULATORY_CARE_PROVIDER_SITE_OTHER): Admitting: Family Medicine

## 2016-04-22 VITALS — BP 130/84 | HR 76 | Temp 98.1°F | Ht 73.5 in | Wt 235.0 lb

## 2016-04-22 DIAGNOSIS — K219 Gastro-esophageal reflux disease without esophagitis: Secondary | ICD-10-CM

## 2016-04-22 DIAGNOSIS — F419 Anxiety disorder, unspecified: Secondary | ICD-10-CM | POA: Diagnosis not present

## 2016-04-22 DIAGNOSIS — E039 Hypothyroidism, unspecified: Secondary | ICD-10-CM

## 2016-04-22 DIAGNOSIS — E785 Hyperlipidemia, unspecified: Secondary | ICD-10-CM | POA: Diagnosis not present

## 2016-04-22 DIAGNOSIS — Z0000 Encounter for general adult medical examination without abnormal findings: Secondary | ICD-10-CM | POA: Diagnosis not present

## 2016-04-22 DIAGNOSIS — F988 Other specified behavioral and emotional disorders with onset usually occurring in childhood and adolescence: Secondary | ICD-10-CM

## 2016-04-22 DIAGNOSIS — E291 Testicular hypofunction: Secondary | ICD-10-CM

## 2016-04-22 MED ORDER — LEVOTHYROXINE SODIUM 137 MCG PO TABS: 137.0000 ug | ORAL_TABLET | Freq: Every day | ORAL | 3 refills | Status: DC

## 2016-04-22 MED ORDER — OMEPRAZOLE 20 MG PO CPDR: 20.0000 mg | DELAYED_RELEASE_CAPSULE | Freq: Every day | ORAL | 3 refills | Status: DC

## 2016-04-22 NOTE — Assessment & Plan Note (Signed)
Controlled with daily PPI - skipped dose leads to breakthrough

## 2016-04-22 NOTE — Assessment & Plan Note (Signed)
Chronic, stable. Continue prozac.

## 2016-04-22 NOTE — Patient Instructions (Addendum)
Return at your convenience for fasting labs 8am (testosterone recheck as well as triglycerides).  Update controlled substance agreement/UDS.  Work on [employer] diet changes and increase regular exercise in routine. Return as needed or in 1 year for next physical.  Other follow up pending repeat lab results.   Health Maintenance, Male A healthy lifestyle and preventative care can promote health and wellness.  Maintain regular health, dental, and eye exams.  Eat a healthy diet. Foods like vegetables, fruits, whole grains, low-fat dairy products, and lean protein foods contain the nutrients you need and are low in calories. Decrease your intake of foods high in solid fats, added sugars, and salt. Get information about a proper diet from your health care provider, if necessary.  Regular physical exercise is one of the most important things you can do for your health. Most adults should get at least 150 minutes of moderate-intensity exercise (any activity that increases your heart rate and causes you to sweat) each week. In addition, most adults need muscle-strengthening exercises on 2 or more days a week.   Maintain a healthy weight. The body mass index (BMI) is a screening tool to identify possible weight problems. It provides an estimate of body fat based on height and weight. Your health care provider can find your BMI and can help you achieve or maintain a healthy weight. For males 20 years and older:  A BMI below 18.5 is considered underweight.  A BMI of 18.5 to 24.9 is normal.  A BMI of 25 to 29.9 is considered overweight.  A BMI of 30 and above is considered obese.  Maintain normal blood lipids and cholesterol by exercising and minimizing your intake of saturated fat. Eat a balanced diet with plenty of fruits and vegetables. Blood tests for lipids and cholesterol should begin at age 50 and be repeated every 5 years. If your lipid or cholesterol levels are high, you are over age 50, or you  are at high risk for heart disease, you may need your cholesterol levels checked more frequently.Ongoing high lipid and cholesterol levels should be treated with medicines if diet and exercise are not working.  If you smoke, find out from your health care provider how to quit. If you do not use tobacco, do not start.  Lung cancer screening is recommended for adults aged 55-80 years who are at high risk for developing lung cancer because of a history of smoking. A yearly low-dose CT scan of the lungs is recommended for people who have at least a 30-pack-year history of smoking and are current smokers or have quit within the past 15 years. A pack year of smoking is smoking an average of 1 pack of cigarettes a day for 1 year (for example, a 30-pack-year history of smoking could mean smoking 1 pack a day for 30 years or 2 packs a day for 15 years). Yearly screening should continue until the smoker has stopped smoking for at least 15 years. Yearly screening should be stopped for people who develop a health problem that would prevent them from having lung cancer treatment.  If you choose to drink alcohol, do not have more than 2 drinks per day. One drink is considered to be 12 oz (360 mL) of beer, 5 oz (150 mL) of wine, or 1.5 oz (45 mL) of liquor.  Avoid the use of street drugs. Do not share needles with anyone. Ask for help if you need support or instructions about stopping the use of drugs.  High blood pressure causes heart disease and increases the risk of stroke. High blood pressure is more likely to develop in:  People who have blood pressure in the end of the normal range (100-139/85-89 mm Hg).  People who are overweight or obese.  People who are African American.  If you are 11-11 years of age, have your blood pressure checked every 3-5 years. If you are 76 years of age or older, have your blood pressure checked every year. You should have your blood pressure measured twice-once when you are at  a hospital or clinic, and once when you are not at a hospital or clinic. Record the average of the two measurements. To check your blood pressure when you are not at a hospital or clinic, you can use:  An automated blood pressure machine at a pharmacy.  A home blood pressure monitor.  If you are 82-70 years old, ask your health care provider if you should take aspirin to prevent heart disease.  Diabetes screening involves taking a blood sample to check your fasting blood sugar level. This should be done once every 3 years after age 14 if you are at a normal weight and without risk factors for diabetes. Testing should be considered at a younger age or be carried out more frequently if you are overweight and have at least 1 risk factor for diabetes.  Colorectal cancer can be detected and often prevented. Most routine colorectal cancer screening begins at the age of 56 and continues through age 66. However, your health care provider may recommend screening at an earlier age if you have risk factors for colon cancer. On a yearly basis, your health care provider may provide home test kits to check for hidden blood in the stool. A small camera at the end of a tube may be used to directly examine the colon (sigmoidoscopy or colonoscopy) to detect the earliest forms of colorectal cancer. Talk to your health care provider about this at age 63 when routine screening begins. A direct exam of the colon should be repeated every 5-10 years through age 85, unless early forms of precancerous polyps or small growths are found.  People who are at an increased risk for hepatitis B should be screened for this virus. You are considered at high risk for hepatitis B if:  You were born in a country where hepatitis B occurs often. Talk with your health care provider about which countries are considered high risk.  Your parents were born in a high-risk country and you have not received a shot to protect against hepatitis B  (hepatitis B vaccine).  You have HIV or AIDS.  You use needles to inject street drugs.  You live with, or have sex with, someone who has hepatitis B.  You are a man who has sex with other men (MSM).  You get hemodialysis treatment.  You take certain medicines for conditions like cancer, organ transplantation, and autoimmune conditions.  Hepatitis C blood testing is recommended for all people born from [birth year] through 1964 and any individual with known risk factors for hepatitis C.  Healthy men should no longer receive prostate-specific antigen (PSA) blood tests as part of routine cancer screening. Talk to your health care provider about prostate cancer screening.  Testicular cancer screening is not recommended for adolescents or adult males who have no symptoms. Screening includes self-exam, a health care provider exam, and other screening tests. Consult with your health care provider about any symptoms you have or any  concerns you have about testicular cancer.  Practice safe sex. Use condoms and avoid high-risk sexual practices to reduce the spread of sexually transmitted infections (STIs).  You should be screened for STIs, including gonorrhea and chlamydia if:  You are sexually active and are younger than 24 years.  You are older than 24 years, and your health care provider tells you that you are at risk for this type of infection.  Your sexual activity has changed since you were last screened, and you are at an increased risk for chlamydia or gonorrhea. Ask your health care provider if you are at risk.  If you are at risk of being infected with HIV, it is recommended that you take a prescription medicine daily to prevent HIV infection. This is called pre-exposure prophylaxis (PrEP). You are considered at risk if:  You are a man who has sex with other men (MSM).  You are a heterosexual man who is sexually active with multiple partners.  You take drugs by injection.  You are  sexually active with a partner who has HIV.  Talk with your health care provider about whether you are at high risk of being infected with HIV. If you choose to begin PrEP, you should first be tested for HIV. You should then be tested every 3 months for as long as you are taking PrEP.  Use sunscreen. Apply sunscreen liberally and repeatedly throughout the day. You should seek shade when your shadow is shorter than you. Protect yourself by wearing long sleeves, pants, a wide-brimmed hat, and sunglasses year round whenever you are outdoors.  Tell your health care provider of new moles or changes in moles, especially if there is a change in shape or color. Also, tell your health care provider if a mole is larger than the size of a pencil eraser.  A one-time screening for abdominal aortic aneurysm (AAA) and surgical repair of large AAAs by ultrasound is recommended for men aged 65-75 years who are current or former smokers.  Stay current with your vaccines (immunizations). This information is not intended to replace advice given to you by your health care provider. Make sure you discuss any questions you have with your health care provider. Document Released: 09/26/2007 Document Revised: 04/20/2014 Document Reviewed: 01/01/2015 Elsevier Interactive Patient Education  2017 [employer].

## 2016-04-22 NOTE — Assessment & Plan Note (Addendum)
Chronic, stable. Pt denies side effects from current regimen - continue.  Update UDS/controlled substance agreement today.

## 2016-04-22 NOTE — Assessment & Plan Note (Addendum)
Confirmatory 8am testing when he returns.  Discussed sxs of hypotestosteronism as well as treatment options (and implications of testosterone supplementation including monitoring with routine labs). Will decide on f/u plan based on lab results.

## 2016-04-22 NOTE — Assessment & Plan Note (Signed)
Trig markedly elevated - pt may had drank [employer] Dew prior to labs. Will rpt fasting check when he returns.

## 2016-04-22 NOTE — Addendum Note (Signed)
Addended by: Eustaquio BoydenGUTIERREZ, Kaira Stringfield on: 04/22/2016 09:33 AM   Modules accepted: Orders

## 2016-04-22 NOTE — Assessment & Plan Note (Signed)
Chronic, stable. Continue current regimen. 

## 2016-04-22 NOTE — Progress Notes (Signed)
BP 130/84   Pulse 76   Temp 98.1 F (36.7 C) (Oral)   Ht 6' 1.5" (1.867 m)   Wt 235 lb (106.6 kg)   BMI 30.58 kg/m    CC: CPE Subjective:    Patient ID: Phillip Gomez, male    DOB: 1965-11-30, 51 y.o.   MRN: [id number]  HPI: Phillip Gomez is a 51 y.o. male presenting on 04/22/2016 for Annual Exam   Preventative: Colon cancer screening - no fmhx Prostate cancer screening - not due yet Flu shot yearly Tdap 2014 Seat belt use discussed Sunscreen use discussed. No changing moles on skin. 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 Interim medical history since our last visit reviewed. Allergies and medications reviewed and updated. Current Outpatient Prescriptions on File Prior to Visit  Medication Sig  . FLUoxetine (PROZAC) 10 MG capsule TAKE 1 CAPSULE DAILY (TAKE WITH 20 MG CAPSULE)  . FLUoxetine (PROZAC) 20 MG capsule TAKE 1 CAPSULE DAILY (TAKE WITH 10 MG CAPSULE)  . levothyroxine (SYNTHROID, LEVOTHROID) 137 MCG tablet TAKE 1 TABLET DAILY BEFORE BREAKFAST (NEED APPOINTMENT FOR FURTHER REFILLS)  . methylphenidate 27 MG PO CR tablet Take 1 tablet (27 mg total) by mouth daily.  Marland Kitchen omeprazole (PRILOSEC) 20 MG capsule TAKE 1 CAPSULE DAILY   No current facility-administered medications on file prior to visit.     Review of Systems  Constitutional: Negative for activity change, appetite change, chills, fatigue, fever and unexpected weight change.  HENT: Negative for hearing loss.   Eyes: Negative for visual disturbance.  Respiratory: Negative for cough, chest tightness, shortness of breath and wheezing.   Cardiovascular: Negative for chest pain, palpitations and leg swelling.    Gastrointestinal: Negative for abdominal distention, abdominal pain, blood in stool, constipation, diarrhea, nausea and vomiting.  Genitourinary: Negative for difficulty urinating and hematuria.  Musculoskeletal: Negative for arthralgias, myalgias and neck pain.  Skin: Negative for rash.  Neurological: Negative for dizziness, seizures, syncope and headaches.  Hematological: Negative for adenopathy. Does not bruise/bleed easily.  Psychiatric/Behavioral: Negative for dysphoric mood. The patient is not nervous/anxious.    Per HPI unless specifically indicated in ROS section     Objective:    BP 130/84   Pulse 76   Temp 98.1 F (36.7 C) (Oral)   Ht 6' 1.5" (1.867 m)   Wt 235 lb (106.6 kg)   BMI 30.58 kg/m   Wt Readings from Last 3 Encounters:  04/22/16 235 lb (106.6 kg)  02/07/15 227 lb (103 kg)  07/13/14 225 lb 4 oz (102.2 kg)    Physical Exam  Constitutional: He is oriented to person, place, and time. He appears well-developed and well-nourished. No distress.  HENT:  Head: Normocephalic and atraumatic.  Right Ear: Hearing, tympanic membrane, external ear and ear canal normal.  Left Ear: Hearing, tympanic membrane, external ear and ear canal normal.  Nose: Nose normal.  Mouth/Throat: Uvula is midline, oropharynx is clear and moist and mucous membranes are normal. No oropharyngeal exudate, posterior oropharyngeal edema or posterior oropharyngeal erythema.  Eyes: Conjunctivae and EOM are normal. Pupils are equal, round, and reactive to light. No scleral icterus.  Neck: Normal range of motion. Neck supple. No thyromegaly present.  Cardiovascular: Normal rate,  regular rhythm, normal heart sounds and intact distal pulses.   No murmur heard. Pulses:      Radial pulses are 2+ on the right side, and 2+ on the left side.  Pulmonary/Chest: Effort normal and breath sounds normal. No respiratory distress. He has no wheezes. He has no rales.  Abdominal: Soft. Bowel sounds are normal. He  exhibits no distension and no mass. There is no tenderness. There is no rebound and no guarding.  Musculoskeletal: Normal range of motion. He exhibits no edema.  Lymphadenopathy:    He has no cervical adenopathy.  Neurological: He is alert and oriented to person, place, and time.  CN grossly intact, station and gait intact  Skin: Skin is warm and dry. No rash noted.  Psychiatric: He has a normal mood and affect. His behavior is normal. Judgment and thought content normal.  Nursing note and vitals reviewed.  Results for orders placed or performed in visit on 04/15/16  Lipid panel  Result Value Ref Range   Cholesterol, Total 158 100 - 199 mg/dL   Triglycerides 960 (H) 0 - 149 mg/dL   HDL 32 (L) >45 mg/dL   VLDL Cholesterol Cal 76 (H) 5 - 40 mg/dL   LDL Calculated 50 0 - 99 mg/dL   Chol/HDL Ratio 4.9 0.0 - 5.0 ratio units  TSH  Result Value Ref Range   TSH 3.460 0.450 - 4.500 uIU/mL  Basic metabolic panel  Result Value Ref Range   Glucose 77 65 - 99 mg/dL   BUN 15 6 - 24 mg/dL   Creatinine, Ser 4.09 0.76 - 1.27 mg/dL   GFR calc non Af Amer 71 >59 mL/min/1.73   GFR calc Af Amer 82 >59 mL/min/1.73   BUN/Creatinine Ratio 13 9 - 20   Sodium 142 134 - 144 mmol/L   Potassium 4.6 3.5 - 5.2 mmol/L   Chloride 99 96 - 106 mmol/L   CO2 24 18 - 29 mmol/L   Calcium 9.3 8.7 - 10.2 mg/dL  CBC with Differential/Platelet  Result Value Ref Range   WBC 7.0 3.4 - 10.8 x10E3/uL   RBC 5.26 4.14 - 5.80 x10E6/uL   Hemoglobin 16.6 13.0 - 17.7 g/dL   Hematocrit 81.1 91.4 - 51.0 %   MCV 91 79 - 97 fL   MCH 31.6 26.6 - 33.0 pg   MCHC 34.6 31.5 - 35.7 g/dL   RDW 78.2 95.6 - 21.3 %   Platelets 234 150 - 379 x10E3/uL   Neutrophils 65 Not Estab. %   Lymphs 24 Not Estab. %   Monocytes 6 Not Estab. %   Eos 3 Not Estab. %   Basos 1 Not Estab. %   Neutrophils Absolute 4.6 1.4 - 7.0 x10E3/uL   Lymphocytes Absolute 1.6 0.7 - 3.1 x10E3/uL   Monocytes Absolute 0.4 0.1 - 0.9 x10E3/uL   EOS (ABSOLUTE) 0.2  0.0 - 0.4 x10E3/uL   Basophils Absolute 0.0 0.0 - 0.2 x10E3/uL   Immature Granulocytes 1 Not Estab. %   Immature Grans (Abs) 0.1 0.0 - 0.1 x10E3/uL  Hepatic function panel  Result Value Ref Range   Total Protein 7.2 6.0 - 8.5 g/dL   Albumin 4.4 3.5 - 5.5 g/dL   Bilirubin Total 0.4 0.0 - 1.2 mg/dL   Bilirubin, Direct 0.86 0.00 - 0.40 mg/dL   Alkaline Phosphatase 98 39 - 117 IU/L   AST 28 0 - 40 IU/L   ALT 38 0 - 44 IU/L  Testosterone,Free and Total  Result Value Ref  Range   Testosterone 172 (L) 264 - 916 ng/dL   Testosterone, Free 8.0 6.8 - 21.5 pg/mL      Assessment & Plan:   Problem List Items Addressed This Visit    ADD (attention deficit disorder)    Chronic, stable. Pt denies side effects from current regimen - continue.  Update UDS/controlled substance agreement today.       Anxiety    Chronic, stable. Continue prozac.       Dyslipidemia    Trig markedly elevated - pt [facility] had [facility] prior to labs. Will rpt fasting check when he returns.       Relevant Orders   Triglycerides   GERD (gastroesophageal reflux disease)    Controlled with daily PPI - skipped dose leads to breakthrough      Health maintenance examination - Primary    Preventative protocols reviewed and updated unless pt declined. Discussed healthy diet and lifestyle.       Hypogonadism in male    Confirmatory 8am testing when he returns.  Discussed sxs of hypotestosteronism as well as treatment options (and implications of testosterone supplementation including monitoring with routine labs). Will decide on f/u plan based on lab results.       Relevant Orders   Testos,Total,Free and SHBG (Male)   FSH/LH   Hypothyroidism    Chronic, stable. Continue current regimen           Follow up plan: Return in about 1 year (around 04/22/2017) for annual exam, prior fasting for blood work.  Eustaquio BoydenJavier Gordana Kewley, MD

## 2016-04-22 NOTE — Progress Notes (Signed)
Pre visit review using our clinic review tool, if applicable. No additional management support is needed unless otherwise documented below in the visit note. 

## 2016-04-22 NOTE — Assessment & Plan Note (Signed)
Preventative protocols reviewed and updated unless pt declined. Discussed healthy diet and lifestyle.  

## 2016-04-23 ENCOUNTER — Other Ambulatory Visit (INDEPENDENT_AMBULATORY_CARE_PROVIDER_SITE_OTHER)

## 2016-04-23 DIAGNOSIS — E291 Testicular hypofunction: Secondary | ICD-10-CM

## 2016-04-23 DIAGNOSIS — E785 Hyperlipidemia, unspecified: Secondary | ICD-10-CM

## 2016-04-23 NOTE — Addendum Note (Signed)
Addended by: Felix AhmadiFRANSEN, Jabez Molner A on: 04/23/2016 08:15 AM   Modules accepted: Orders

## 2016-04-23 NOTE — Addendum Note (Signed)
Addended by: Felix AhmadiFRANSEN, Rozelia Catapano A on: 04/23/2016 08:25 AM   Modules accepted: Orders

## 2016-04-23 NOTE — Addendum Note (Signed)
Addended by: Safina Huard A on: 04/23/2016 08:15 AM   Modules accepted: Orders  

## 2016-04-24 LAB — FSH/LH
FSH: 5.3 m[IU]/mL (ref 1.5–12.4)
LH: 3.8 m[IU]/mL (ref 1.7–8.6)

## 2016-04-24 LAB — TRIGLYCERIDES: TRIGLYCERIDES: 174 mg/dL — AB (ref 0–149)

## 2016-04-25 LAB — TESTOSTERONE,FREE AND TOTAL
TESTOSTERONE FREE: 15.7 pg/mL (ref 6.8–21.5)
TESTOSTERONE: 294 ng/dL (ref 264–916)

## 2016-04-27 ENCOUNTER — Encounter: Payer: Self-pay | Admitting: *Deleted

## 2016-05-15 ENCOUNTER — Other Ambulatory Visit: Payer: Self-pay

## 2016-05-15 MED ORDER — METHYLPHENIDATE HCL ER (OSM) 27 MG PO TBCR: 27.0000 mg | EXTENDED_RELEASE_TABLET | Freq: Every day | ORAL | 0 refills | Status: DC

## 2016-05-15 NOTE — Telephone Encounter (Signed)
Pt left v/m requesting rx methylphenidate. Call when ready for pick up. Last printed # 30 on 04/14/16. Pt last seen 04/22/16.

## 2016-05-15 NOTE — Telephone Encounter (Signed)
Notified patient that R/X for methylphenidate is ready for pick up.

## 2016-05-15 NOTE — Telephone Encounter (Signed)
Printed and in Kim's box 

## 2016-06-14 ENCOUNTER — Other Ambulatory Visit: Payer: Self-pay | Admitting: Family Medicine

## 2016-06-16 ENCOUNTER — Other Ambulatory Visit: Payer: Self-pay

## 2016-06-16 NOTE — Telephone Encounter (Signed)
Pt left v/m requesting rx for methylphenidate. Call when ready for pick up.last printed # 30 on 05/15/16. Last annual exam on 04/22/16.

## 2016-06-17 MED ORDER — METHYLPHENIDATE HCL ER (OSM) 27 MG PO TBCR: 27.0000 mg | EXTENDED_RELEASE_TABLET | Freq: Every day | ORAL | 0 refills | Status: DC

## 2016-06-17 NOTE — Telephone Encounter (Signed)
Printed.  Will be there to sign tomorrow.  Thanks.

## 2016-06-18 ENCOUNTER — Encounter: Payer: Self-pay | Admitting: Family Medicine

## 2016-06-18 NOTE — Telephone Encounter (Signed)
Spoke to pt and informed him Rx is available for pickup from the front desk. Pt advised third party unable to pickup .  

## 2016-07-14 ENCOUNTER — Other Ambulatory Visit: Payer: Self-pay

## 2016-07-14 MED ORDER — METHYLPHENIDATE HCL ER (OSM) 27 MG PO TBCR: 27.0000 mg | EXTENDED_RELEASE_TABLET | Freq: Every day | ORAL | 0 refills | Status: DC

## 2016-07-14 NOTE — Telephone Encounter (Signed)
Pt left v/m requesting rx methylphenidate. Call when ready for pick up. Last printed # 30 on 06/17/16; last annual 04/22/16.

## 2016-07-14 NOTE — Telephone Encounter (Signed)
Pt notified Rx ready for pickup 

## 2016-07-14 NOTE — Telephone Encounter (Signed)
Printed and in kims'box  

## 2016-08-11 ENCOUNTER — Other Ambulatory Visit: Payer: Self-pay

## 2016-08-11 NOTE — Telephone Encounter (Signed)
Pt left v/m requesting rx methyphenidate. Call when ready for pick up. Last seen annual 04/22/16; rx last printed # 30 on 07/14/16.

## 2016-08-13 MED ORDER — METHYLPHENIDATE HCL ER (OSM) 27 MG PO TBCR: 27.0000 mg | EXTENDED_RELEASE_TABLET | Freq: Every day | ORAL | 0 refills | Status: DC

## 2016-08-13 NOTE — Telephone Encounter (Signed)
Printed and in my CMA box.  

## 2016-08-13 NOTE — Telephone Encounter (Signed)
Pt notified Rx ready for pickup 

## 2016-09-15 ENCOUNTER — Other Ambulatory Visit: Payer: Self-pay

## 2016-09-15 NOTE — Telephone Encounter (Signed)
Pt left v/m requesting rx methylphenidate. Call when rx ready for pick up. Last printed # 30 on 08/13/16. Pt last seen annual 04/22/16.pt has 2 days of med left.

## 2016-09-16 MED ORDER — METHYLPHENIDATE HCL ER (OSM) 27 MG PO TBCR: 27.0000 mg | EXTENDED_RELEASE_TABLET | Freq: Every day | ORAL | 0 refills | Status: DC

## 2016-09-16 NOTE — Telephone Encounter (Signed)
Printed and in CMA box 

## 2016-09-16 NOTE — Telephone Encounter (Signed)
Spoke to pt and informed him Rx is available for pickup from the front desk 

## 2016-09-30 ENCOUNTER — Encounter: Payer: Self-pay | Admitting: Family Medicine

## 2016-09-30 ENCOUNTER — Emergency Department

## 2016-09-30 ENCOUNTER — Emergency Department
Admission: EM | Admit: 2016-09-30 | Discharge: 2016-09-30 | Disposition: A | Discharge status: Home / Self Care | Attending: Emergency Medicine | Admitting: Emergency Medicine

## 2016-09-30 ENCOUNTER — Encounter: Payer: Self-pay | Admitting: Emergency Medicine

## 2016-09-30 DIAGNOSIS — K5730 Diverticulosis of large intestine without perforation or abscess without bleeding: Secondary | ICD-10-CM | POA: Diagnosis not present

## 2016-09-30 DIAGNOSIS — E039 Hypothyroidism, unspecified: Secondary | ICD-10-CM | POA: Diagnosis not present

## 2016-09-30 DIAGNOSIS — N289 Disorder of kidney and ureter, unspecified: Secondary | ICD-10-CM | POA: Insufficient documentation

## 2016-09-30 DIAGNOSIS — Z79899 Other long term (current) drug therapy: Secondary | ICD-10-CM | POA: Insufficient documentation

## 2016-09-30 DIAGNOSIS — M545 Low back pain, unspecified: Secondary | ICD-10-CM

## 2016-09-30 HISTORY — DX: Diverticulosis of intestine, part unspecified, without perforation or abscess without bleeding: K57.90

## 2016-09-30 HISTORY — DX: Fatty (change of) liver, not elsewhere classified: K76.0

## 2016-09-30 LAB — URINALYSIS, COMPLETE (UACMP) WITH MICROSCOPIC
Bacteria, UA: NONE SEEN
Bilirubin Urine: NEGATIVE
Glucose, UA: NEGATIVE mg/dL
HGB URINE DIPSTICK: NEGATIVE
Ketones, ur: NEGATIVE mg/dL
Leukocytes, UA: NEGATIVE
NITRITE: NEGATIVE
PROTEIN: NEGATIVE mg/dL
RBC / HPF: NONE SEEN RBC/hpf (ref 0–5)
Specific Gravity, Urine: >1.03 — ABNORMAL HIGH (ref 1.005–1.030)
Squamous Epithelial / LPF: NONE SEEN
pH: 5.5 (ref 5.0–8.0)

## 2016-09-30 LAB — COMPREHENSIVE METABOLIC PANEL
ALBUMIN: 4.1 g/dL (ref 3.5–5.0)
ALT: 38 U/L (ref 17–63)
ANION GAP: 8 (ref 5–15)
AST: 31 U/L (ref 15–41)
Alkaline Phosphatase: 76 U/L (ref 38–126)
BUN: 31 mg/dL — ABNORMAL HIGH (ref 6–20)
CHLORIDE: 102 mmol/L (ref 101–111)
CO2: 26 mmol/L (ref 22–32)
Calcium: 9.3 mg/dL (ref 8.9–10.3)
Creatinine, Ser: 1.17 mg/dL (ref 0.61–1.24)
GFR calc Af Amer: >60 mL/min (ref >60)
GFR calc non Af Amer: >60 mL/min (ref >60)
GLUCOSE: 117 mg/dL — AB (ref 65–99)
POTASSIUM: 3.8 mmol/L (ref 3.5–5.1)
SODIUM: 136 mmol/L (ref 135–145)
Total Bilirubin: 0.8 mg/dL (ref 0.3–1.2)
Total Protein: 7.5 g/dL (ref 6.5–8.1)

## 2016-09-30 LAB — CBC
HEMATOCRIT: 46.6 % (ref 40.0–52.0)
HEMOGLOBIN: 16.5 g/dL (ref 13.0–18.0)
MCH: 32 pg (ref 26.0–34.0)
MCHC: 35.3 g/dL (ref 32.0–36.0)
MCV: 90.7 fL (ref 80.0–100.0)
Platelets: 190 10*3/uL (ref 150–440)
RBC: 5.14 MIL/uL (ref 4.40–5.90)
RDW: 12.9 % (ref 11.5–14.5)
WBC: 7.2 10*3/uL (ref 3.8–10.6)

## 2016-09-30 LAB — LIPASE, BLOOD: Lipase: 49 U/L (ref 11–51)

## 2016-09-30 NOTE — ED Provider Notes (Signed)
[facility] Emergency Department Provider Note    First MD Initiated Contact with Patient 09/30/16 [phone]     (approximate)  I have reviewed the triage vital signs and the nursing notes.   HISTORY  Chief Complaint Abdominal Pain   HPI Phillip Gomez is a 51 y.o. male with below list of chronic medical conditions presents to the emergency department with currently 0 out of 10 left lower back pain radiating to the left groin tonight. Patient states that he felt the pain yesterday which was brief and described as aching but returns tonight 10 out of 10 appreciated left lower quadrant/flank pain. Patient admitted to nausea however no vomiting. Patient denies any urinary symptoms no hematuria dysuria. Patient denies any fever no constipation or diarrhea. Patient denies any previous history of kidney stones.   Past Medical History:  Diagnosis Date  . ADHD (attention deficit hyperactivity disorder) 2005  . Anxiety   . GERD (gastroesophageal reflux disease)    with HH  . History of chicken pox   . Hypothyroidism 2012    Patient Active Problem List   Diagnosis Date Noted  . Health maintenance examination 04/22/2016  . Dyslipidemia 04/13/2016  . Plantar fasciitis 10/27/2015  . Hypogonadism in male 02/19/2015  . Chronic fatigue 02/07/2015  . Hypothyroidism   . ADD (attention deficit disorder)   . Anxiety   . GERD (gastroesophageal reflux disease)     Past Surgical History:  Procedure Laterality Date  . FL BARIUM SWALLOW ([facility] HX)     HH, GERD  . LASIK  1994  . TONSILLECTOMY  [id number]  . WISDOM TOOTH EXTRACTION  1990    Prior to Admission medications   Medication Sig Start Date End Date Taking? Authorizing Provider  FLUoxetine (PROZAC) 10 MG capsule TAKE 1 CAPSULE DAILY (TAKE WITH 20 MG CAPSULE) 06/15/16   Eustaquio Boyden, MD  FLUoxetine (PROZAC) 20 MG capsule TAKE 1 CAPSULE DAILY (TAKE WITH 10 MG CAPSULE) 06/15/16   Eustaquio Boyden, MD  levothyroxine  (SYNTHROID, LEVOTHROID) 137 MCG tablet Take 1 tablet (137 mcg total) by mouth daily before breakfast. 04/22/16   Eustaquio Boyden, MD  methylphenidate 27 MG PO CR tablet Take 1 tablet (27 mg total) by mouth daily. 09/16/16   Eustaquio Boyden, MD  NONFORMULARY OR COMPOUNDED ITEM Allergy injections    [provider]  omeprazole (PRILOSEC) 20 MG capsule Take 1 capsule (20 mg total) by mouth daily. 04/22/16   Eustaquio Boyden, MD    Allergies Other  Family History  Problem Relation Age of Onset  . CAD Father 33       MI  . Diabetes Brother   . Cancer Sister        leukemia (half-sister)  . Stroke Neg Hx   . Hypertension Neg Hx     Social History Social History  Substance Use Topics  . Smoking status: Never Smoker  . Smokeless tobacco: Never Used  . Alcohol use No    Review of Systems Constitutional: No fever/chills Eyes: No visual changes. ENT: No sore throat. Cardiovascular: Denies chest pain. Respiratory: Denies shortness of breath. Gastrointestinal: Positive for left lower quadrant abdominal pain.  No nausea, no vomiting.  No diarrhea.  No constipation. Genitourinary: Negative for dysuria. Musculoskeletal: Negative for neck pain.  Positive for left flank pain Integumentary: Negative for rash. Neurological: Negative for headaches, focal weakness or numbness.   ____________________________________________   PHYSICAL EXAM:  VITAL SIGNS: ED Triage Vitals [09/30/16 0032]  Enc Vitals Group  BP 134/86     Pulse Rate 68     Resp 18     Temp 97.7 F (36.5 C)     Temp src      SpO2 97 %     Weight 107 kg (236 lb)     Height 1.829 m (6')     Head Circumference      Peak Flow      Pain Score 5     Pain Loc      Pain Edu?      Excl. in GC?     Constitutional: Alert and oriented. Well appearing and in no acute distress. Eyes: Conjunctivae are normal.  Head: Atraumatic. Mouth/Throat: Mucous membranes are moist. Oropharynx non-erythematous. Neck: No  stridor.   Cardiovascular: Normal rate, regular rhythm. Good peripheral circulation. Grossly normal heart sounds. Respiratory: Normal respiratory effort.  No retractions. Lungs CTAB. Gastrointestinal: Soft and nontender. No distention.  Musculoskeletal: No lower extremity tenderness nor edema. No gross deformities of extremities. Neurologic:  Normal speech and language. No gross focal neurologic deficits are appreciated.  Skin:  Skin is warm, dry and intact. No rash noted. Psychiatric: Mood and affect are normal. Speech and behavior are normal.  ____________________________________________   LABS (all labs ordered are listed, but only abnormal results are displayed)  Labs Reviewed  COMPREHENSIVE METABOLIC PANEL - Abnormal; Notable for the following:       Result Value   Glucose, Bld 117 (*)    BUN 31 (*)    All other components within normal limits  URINALYSIS, COMPLETE (UACMP) WITH MICROSCOPIC - Abnormal; Notable for the following:    Specific Gravity, Urine >1.030 (*)    All other components within normal limits  LIPASE, BLOOD  CBC     RADIOLOGY I, McLendon-Chisholm N Blondina Coderre, personally viewed and evaluated these images (plain radiographs) as part of my medical decision making, as well as reviewing the written report by the radiologist.  Ct Renal Stone Study  Result Date: 09/30/2016 CLINICAL DATA:  Left flank and groin pain. EXAM: CT ABDOMEN AND PELVIS WITHOUT CONTRAST TECHNIQUE: Multidetector CT imaging of the abdomen and pelvis was performed following the standard protocol without IV contrast. COMPARISON:  None. FINDINGS: Lower chest: Calcified nodule in the left lower lobe consistent with prior granulomatous disease. No consolidation or pleural fluid. Hepatobiliary: Decreased hepatic density consistent with steatosis. No evidence focal hepatic lesion allowing for lack contrast. Gallbladder is decompressed, no calcified stone or biliary dilatation. Pancreas: No ductal dilatation or  inflammation. Spleen: Scattered granuloma. Adrenals/Urinary Tract: Normal adrenal glands. No renal stones. No hydronephrosis or perinephric edema. Ureters are decompressed without stones along the course. Low-density lesion in the lower left kidney with peripheral calcification, incompletely characterized without contrast. Urinary bladder is physiologically distended, no bladder stone. No bladder wall thickening. Stomach/Bowel: Stomach distended with ingested material. No small bowel dilatation or obstruction. Normal appendix. Moderate colonic stool burden. Scattered diverticulosis of the descending and sigmoid colon without acute diverticulitis. Vascular/Lymphatic: No significant vascular findings are present. No enlarged abdominal or pelvic lymph nodes. Reproductive: Prostate is unremarkable. Other: No free air, free fluid, or intra-abdominal fluid collection. Small fat containing umbilical hernia. Musculoskeletal: No acute or significant osseous findings. IMPRESSION: 1. No renal stones or obstructive uropathy. No explanation for left flank or groin pain. 2. Hepatic steatosis.  Colonic diverticulosis. 3. Lower pole left renal lesion with peripheral calcification, likely a minimally complicated cyst. This is incompletely characterized without contrast. Recommend nonemergent characterization with renal protocol MRI. Electronically  Signed   By: Rubye Oaks M.D.   On: 09/30/2016 04:20     Procedures   ____________________________________________   INITIAL IMPRESSION / ASSESSMENT AND PLAN / ED COURSE  Pertinent labs & imaging results that were available during my care of the patient were reviewed by me and considered in my medical decision making (see chart for details).  51 year old male presenting with history and physical exam concern for possible kidney stone given complete resolution of pain and the nature and location of the pain. I suspect that the patient may have passed his kidney stone  while in the emergency department given complete resolution of pain at present.      ____________________________________________  FINAL CLINICAL IMPRESSION(S) / ED DIAGNOSES  Final diagnoses:  Acute bilateral low back pain without sciatica  Diverticulosis of large intestine without hemorrhage     MEDICATIONS GIVEN DURING THIS VISIT:  Medications - No data to display   NEW OUTPATIENT MEDICATIONS STARTED DURING THIS VISIT:  New Prescriptions   No medications on file    Modified Medications   No medications on file    Discontinued Medications   No medications on file     Note:  This document was prepared using Dragon voice recognition software and may include unintentional dictation errors.    Darci Current, MD 10/01/16 [phone]

## 2016-09-30 NOTE — ED Triage Notes (Signed)
Patient ambulatory to triage with steady gait, without difficulty or distress noted; pt reports left lower abd pain, nonradiating; st recent left lower back pain with no injury

## 2016-10-12 ENCOUNTER — Other Ambulatory Visit: Payer: Self-pay | Admitting: *Deleted

## 2016-10-12 MED ORDER — METHYLPHENIDATE HCL ER (OSM) 27 MG PO TBCR: 27.0000 mg | EXTENDED_RELEASE_TABLET | Freq: Every day | ORAL | 0 refills | Status: DC

## 2016-10-12 NOTE — Telephone Encounter (Signed)
Printed and in CMA box 

## 2016-10-12 NOTE — Telephone Encounter (Signed)
Spoke to pt. Rx up front ready for pickup 

## 2016-10-12 NOTE — Telephone Encounter (Signed)
Patient left a voicemail requesting refill on Methylphenidate Last refill 09/16/16 #30 Last office visit 04/22/16

## 2016-11-09 ENCOUNTER — Other Ambulatory Visit: Payer: Self-pay | Admitting: *Deleted

## 2016-11-09 NOTE — Telephone Encounter (Signed)
Patient left a voicemail requesting a refill on Methylphenidate Last refill 10/12/16 #30 Last office visit 04/22/16

## 2016-11-10 MED ORDER — METHYLPHENIDATE HCL ER (OSM) 27 MG PO TBCR: 27.0000 mg | EXTENDED_RELEASE_TABLET | Freq: Every day | ORAL | 0 refills | Status: DC

## 2016-11-10 NOTE — Telephone Encounter (Signed)
Printed and in CMA box 

## 2016-11-10 NOTE — Telephone Encounter (Signed)
Lm on pts vm and informed him Rx is available for pickup from the front desk 

## 2016-12-11 ENCOUNTER — Other Ambulatory Visit: Payer: Self-pay

## 2016-12-11 MED ORDER — METHYLPHENIDATE HCL ER (OSM) 27 MG PO TBCR: 27.0000 mg | EXTENDED_RELEASE_TABLET | Freq: Every day | ORAL | 0 refills | Status: DC

## 2016-12-11 NOTE — Telephone Encounter (Signed)
Printed and in CMA box 

## 2016-12-11 NOTE — Telephone Encounter (Signed)
Pt left v/m requesting rx methylphenidate. Call when ready for pick up. Last printed # 30 on 11/10/16. Last annual 04/22/16. Pt is not sure if has enough med to last until 12/15/16 and would like to pick up rx 12/11/16.

## 2016-12-15 NOTE — Telephone Encounter (Signed)
Lm on pts vm and informed him Rx is available for pickup from the front desk 

## 2016-12-31 ENCOUNTER — Other Ambulatory Visit: Payer: Self-pay | Admitting: Family Medicine

## 2017-01-11 ENCOUNTER — Other Ambulatory Visit: Payer: Self-pay

## 2017-01-11 MED ORDER — METHYLPHENIDATE HCL ER (OSM) 27 MG PO TBCR: 27.0000 mg | EXTENDED_RELEASE_TABLET | Freq: Every day | ORAL | 0 refills | Status: DC

## 2017-01-11 NOTE — Telephone Encounter (Signed)
Printed and in CMA box 

## 2017-01-11 NOTE — Telephone Encounter (Signed)
Left message on vm per dpr notifying pt his rx is ready to pick up. Placed rx at front office.

## 2017-01-11 NOTE — Telephone Encounter (Signed)
Pt left v/m requesting rx for methylphenidate. Call when ready for pick up. Last printed # 30 on 12/11/16. Last seen annual 04/22/16.

## 2017-02-11 HISTORY — PX: COLONOSCOPY: SHX174

## 2017-02-12 ENCOUNTER — Other Ambulatory Visit: Payer: Self-pay | Admitting: Family Medicine

## 2017-02-12 NOTE — Telephone Encounter (Signed)
Copied from CRM [id number]. Topic: Inquiry >> Feb 12, 2017  2:22 PM Phillip Gomez, Temeka L, RMA wrote: Reason for CRM: patient is requesting medication refill on Methylphenidate 27 mg, please call patient when prescription is ready for pickup

## 2017-02-12 NOTE — Telephone Encounter (Signed)
Last refill 01/11/17 #30 Last office visit 04/22/16

## 2017-02-14 MED ORDER — METHYLPHENIDATE HCL ER (OSM) 27 MG PO TBCR: 27.0000 mg | EXTENDED_RELEASE_TABLET | Freq: Every day | ORAL | 0 refills | Status: DC

## 2017-02-14 NOTE — Telephone Encounter (Signed)
Printed.  Thanks.  

## 2017-02-15 NOTE — Telephone Encounter (Signed)
Patient advised.  Rx left at front desk for pick up. 

## 2017-03-12 LAB — HM COLONOSCOPY

## 2017-03-15 ENCOUNTER — Other Ambulatory Visit: Payer: Self-pay | Admitting: Family Medicine

## 2017-03-15 MED ORDER — METHYLPHENIDATE HCL ER (OSM) 27 MG PO TBCR: 27.0000 mg | EXTENDED_RELEASE_TABLET | Freq: Every day | ORAL | 0 refills | Status: DC

## 2017-03-15 NOTE — Telephone Encounter (Signed)
Printed and in CMA box 

## 2017-03-15 NOTE — Telephone Encounter (Signed)
Last written 02-14-17 #30 Last OV 04-22-16 No Future OV

## 2017-03-15 NOTE — Telephone Encounter (Signed)
Left message on vm per dpr notifying pt rx is ready to pick up. [Placed rx at front office.]  

## 2017-03-15 NOTE — Addendum Note (Signed)
Addended by: Eustaquio BoydenGUTIERREZ, Lillionna Nabi on: 03/15/2017 05:38 PM   Modules accepted: Orders

## 2017-03-15 NOTE — Telephone Encounter (Signed)
Pt requesting refill on Methylphenidate.

## 2017-04-01 ENCOUNTER — Other Ambulatory Visit: Payer: Self-pay | Admitting: Family Medicine

## 2017-04-01 NOTE — Telephone Encounter (Signed)
Pt. Requesting medication that needs approval. Thanks.

## 2017-04-01 NOTE — Telephone Encounter (Signed)
Spoke to pt and advised 

## 2017-04-01 NOTE — Telephone Encounter (Signed)
Copied from CRM [id number]. Topic: Inquiry >> Apr 01, 2017  8:07 AM Yvonna Alanisobinson, Andra M wrote: Reason for CRM: Patient called requesting a refill of methylphenidate 27 MG PO CR tablet. Patient stated that he will come by the office to pick up the prescription once it is ready. Please call the patient.       Thank You!!!

## 2017-04-01 NOTE — Telephone Encounter (Signed)
rx has been requested too soon. Last Rx 03/15/2017

## 2017-04-17 ENCOUNTER — Other Ambulatory Visit: Payer: Self-pay | Admitting: Family Medicine

## 2017-04-19 ENCOUNTER — Other Ambulatory Visit: Payer: Self-pay | Admitting: Family Medicine

## 2017-04-19 NOTE — Telephone Encounter (Signed)
Copied from CRM [id number]. Topic: Quick Communication - Rx Refill/Question >> Apr 19, 2017  8:21 AM Lelon FrohlichGolden, Samar Venneman, RMA wrote: Has the patient contacted their pharmacy? no   (Agent: If no, request that the patient contact the pharmacy for the refill.) methylphenidate 27 mg   Preferred Pharm[place] (with phone number or street [place] [facility] [place]   Agent: Please be advised that RX refills may take up to 3 business days. We ask that you follow-up with your pharmacy.

## 2017-04-19 NOTE — Telephone Encounter (Signed)
Last office visit 04/22/16 Last refill 03/15/17 #30

## 2017-04-20 ENCOUNTER — Telehealth: Payer: Self-pay

## 2017-04-20 MED ORDER — METHYLPHENIDATE HCL ER (OSM) 27 MG PO TBCR: 27.0000 mg | EXTENDED_RELEASE_TABLET | Freq: Every day | ORAL | 0 refills | Status: DC

## 2017-04-20 NOTE — Telephone Encounter (Signed)
I spoke with pt and Robin at front desk is going to schedule CPX and then will have pt sign for rx. Pt has appt 05/24/17 for CPX.

## 2017-04-20 NOTE — Telephone Encounter (Signed)
See refill request from 04/19/17.

## 2017-04-20 NOTE — Telephone Encounter (Signed)
Printed and handed to Rena to give to pt who stopped by office currently.

## 2017-04-20 NOTE — Telephone Encounter (Signed)
Copied from CRM [id number]. Topic: Quick Communication - Rx Refill/Question >> Apr 19, 2017  8:21 AM Lelon FrohlichGolden, Tashia, RMA wrote: Has the patient contacted their pharmacy? no   (Agent: If no, request that the patient contact the pharmacy for the refill.) methylphenidate 27 mg   Preferred Pharmacy (with phone number or street name): [facility] [place]   Agent: Please be advised that RX refills may take up to 3 business days. We ask that you follow-up with your pharmacy. >> Apr 20, 2017  2:22 PM Gerrianne ScalePayne, Angela L wrote: Patient calling back about med refill methylphenidate 27 MG PO CR tablet I advised him that he may need a OV since its been almost a year since he's been in the office pt states that they refill it every month

## 2017-04-29 ENCOUNTER — Other Ambulatory Visit: Payer: Self-pay | Admitting: Family Medicine

## 2017-05-13 ENCOUNTER — Other Ambulatory Visit: Payer: Self-pay | Admitting: Family Medicine

## 2017-05-13 NOTE — Telephone Encounter (Signed)
Copied from CRM [id number]. Topic: General - Other >> May 13, 2017  8:32 AM Raquel SarnaHayes, Teresa G wrote: Methylthenidate 27mg   Pt needs to come by to pick up the Rx.  Pt has 6 days left.

## 2017-05-13 NOTE — Telephone Encounter (Signed)
Methylthenidate refill Last OV: 04/22/16 Last Refill:04/20/17 Pharmacy:Pick up prescription.   Pt. Has appointment 05/24/17

## 2017-05-13 NOTE — Telephone Encounter (Signed)
Last Rx 04/20/17. Last OV 04/2017

## 2017-05-14 MED ORDER — METHYLPHENIDATE HCL ER (OSM) 27 MG PO TBCR: 27.0000 mg | EXTENDED_RELEASE_TABLET | Freq: Every day | ORAL | 0 refills | Status: DC

## 2017-05-14 NOTE — Telephone Encounter (Signed)
Sent electronically 

## 2017-05-20 ENCOUNTER — Other Ambulatory Visit (INDEPENDENT_AMBULATORY_CARE_PROVIDER_SITE_OTHER)

## 2017-05-20 ENCOUNTER — Other Ambulatory Visit: Payer: Self-pay | Admitting: Family Medicine

## 2017-05-20 DIAGNOSIS — E039 Hypothyroidism, unspecified: Secondary | ICD-10-CM

## 2017-05-20 DIAGNOSIS — E785 Hyperlipidemia, unspecified: Secondary | ICD-10-CM | POA: Diagnosis not present

## 2017-05-20 DIAGNOSIS — E291 Testicular hypofunction: Secondary | ICD-10-CM

## 2017-05-20 NOTE — Addendum Note (Signed)
Addended by: Wendi MayaGREESON, Johsua Shevlin on: 05/20/2017 08:49 AM   Modules accepted: Orders

## 2017-05-20 NOTE — Addendum Note (Signed)
Addended by: Darlyn Repsher on: 05/20/2017 08:48 AM   Modules accepted: Orders  

## 2017-05-20 NOTE — Addendum Note (Signed)
Addended by: Wendi MayaGREESON, Maliha Outten on: 05/20/2017 08:48 AM   Modules accepted: Orders

## 2017-05-24 ENCOUNTER — Encounter: Payer: Self-pay | Admitting: Family Medicine

## 2017-05-24 ENCOUNTER — Ambulatory Visit (INDEPENDENT_AMBULATORY_CARE_PROVIDER_SITE_OTHER): Admitting: Family Medicine

## 2017-05-24 VITALS — BP 120/78 | HR 65 | Temp 98.2°F | Ht 72.0 in | Wt 226.0 lb

## 2017-05-24 DIAGNOSIS — F988 Other specified behavioral and emotional disorders with onset usually occurring in childhood and adolescence: Secondary | ICD-10-CM

## 2017-05-24 DIAGNOSIS — E785 Hyperlipidemia, unspecified: Secondary | ICD-10-CM | POA: Diagnosis not present

## 2017-05-24 DIAGNOSIS — K219 Gastro-esophageal reflux disease without esophagitis: Secondary | ICD-10-CM | POA: Diagnosis not present

## 2017-05-24 DIAGNOSIS — E669 Obesity, unspecified: Secondary | ICD-10-CM

## 2017-05-24 DIAGNOSIS — Z0000 Encounter for general adult medical examination without abnormal findings: Secondary | ICD-10-CM

## 2017-05-24 DIAGNOSIS — E039 Hypothyroidism, unspecified: Secondary | ICD-10-CM

## 2017-05-24 DIAGNOSIS — N289 Disorder of kidney and ureter, unspecified: Secondary | ICD-10-CM | POA: Diagnosis not present

## 2017-05-24 DIAGNOSIS — F419 Anxiety disorder, unspecified: Secondary | ICD-10-CM

## 2017-05-24 NOTE — Assessment & Plan Note (Signed)
Preventative protocols reviewed and updated unless pt declined. Discussed healthy diet and lifestyle.  

## 2017-05-24 NOTE — Assessment & Plan Note (Signed)
Chronic, stable. Continue current regimen. 

## 2017-05-24 NOTE — Assessment & Plan Note (Signed)
Chronic, improved with healthier diet changes over the past 4 months. Sustainable.  The 10-year ASCVD risk score Denman George(Goff [place] Montez HagemanJr., et al., 2013) is: 2.7%   Values used to calculate the score:     Age: [age] years     Sex: Male     Is Non-Hispanic African American: No     Diabetic: No     Tobacco smoker: No     Systolic Blood Pressure: 120 mmHg     Is BP treated: No     HDL Cholesterol: 39 mg/dL     Total Cholesterol: 152 mg/dL

## 2017-05-24 NOTE — Patient Instructions (Addendum)
Update UDS and controlled substance agreement today.  You are doing well today - congratulations on health diet changes.  Return as needed or in 1 year for next physical.  Bring me copy of your colonoscopy report.   Health Maintenance, Male A healthy lifestyle and preventive care is important for your health and wellness. Ask your health care provider about what schedule of regular examinations is right for you. What should I know about weight and diet? Eat a Healthy Diet  Eat plenty of vegetables, fruits, whole grains, low-fat dairy products, and lean protein.  Do not eat a lot of foods high in solid fats, added sugars, or salt.  Maintain a Healthy Weight Regular exercise can help you achieve or maintain a healthy weight. You should:  Do at least 150 minutes of exercise each week. The exercise should increase your heart rate and make you sweat (moderate-intensity exercise).  Do strength-training exercises at least twice a week.  Watch Your Levels of Cholesterol and Blood Lipids  Have your blood tested for lipids and cholesterol every 5 years starting at 51 years of age. If you are at high risk for heart disease, you should start having your blood tested when you are 51 years old. You may need to have your cholesterol levels checked more often if: ? Your lipid or cholesterol levels are high. ? You are older than 51 years of age. ? You are at high risk for heart disease.  What should I know about cancer screening? Many types of cancers can be detected early and may often be prevented. Lung Cancer  You should be screened every year for lung cancer if: ? You are a current smoker who has smoked for at least 30 years. ? You are a former smoker who has quit within the past 15 years.  Talk to your health care provider about your screening options, when you should start screening, and how often you should be screened.  Colorectal Cancer  Routine colorectal cancer screening usually  begins at 51 years of age and should be repeated every 5-10 years until you are 51 years old. You may need to be screened more often if early forms of precancerous polyps or small growths are found. Your health care provider may recommend screening at an earlier age if you have risk factors for colon cancer.  Your health care provider may recommend using home test kits to check for hidden blood in the stool.  A small camera at the end of a tube can be used to examine your colon (sigmoidoscopy or colonoscopy). This checks for the earliest forms of colorectal cancer.  Prostate and Testicular Cancer  Depending on your age and overall health, your health care provider may do certain tests to screen for prostate and testicular cancer.  Talk to your health care provider about any symptoms or concerns you have about testicular or prostate cancer.  Skin Cancer  Check your skin from head to toe regularly.  Tell your health care provider about any new moles or changes in moles, especially if: ? There is a change in a mole's size, shape, or color. ? You have a mole that is larger than a pencil eraser.  Always use sunscreen. Apply sunscreen liberally and repeat throughout the day.  Protect yourself by wearing long sleeves, pants, a wide-brimmed hat, and sunglasses when outside.  What should I know about heart disease, diabetes, and high blood pressure?  If you are 2018-51 years of age, have your blood  pressure checked every 3-5 years. If you are 83 years of age or older, have your blood pressure checked every year. You should have your blood pressure measured twice-once when you are at a hospital or clinic, and once when you are not at a hospital or clinic. Record the average of the two measurements. To check your blood pressure when you are not at a hospital or clinic, you can use: ? An automated blood pressure machine at a pharmacy. ? A home blood pressure monitor.  Talk to your health care  provider about your target blood pressure.  If you are between 90-9 years old, ask your health care provider if you should take aspirin to prevent heart disease.  Have regular diabetes screenings by checking your fasting blood sugar level. ? If you are at a normal weight and have a low risk for diabetes, have this test once every three years after the age of 5. ? If you are overweight and have a high risk for diabetes, consider being tested at a younger age or more often.  A one-time screening for abdominal aortic aneurysm (AAA) by ultrasound is recommended for men aged 56-75 years who are current or former smokers. What should I know about preventing infection? Hepatitis B If you have a higher risk for hepatitis B, you should be screened for this virus. Talk with your health care provider to find out if you are at risk for hepatitis B infection. Hepatitis C Blood testing is recommended for:  Everyone born from [birth year] through 1964.  Anyone with known risk factors for hepatitis C.  Sexually Transmitted Diseases (STDs)  You should be screened each year for STDs including gonorrhea and chlamydia if: ? You are sexually active and are younger than 51 years of age. ? You are older than 50 years of age and your health care provider tells you that you are at risk for this type of infection. ? Your sexual activity has changed since you were last screened and you are at an increased risk for chlamydia or gonorrhea. Ask your health care provider if you are at risk.  Talk with your health care provider about whether you are at high risk of being infected with HIV. Your health care provider may recommend a prescription medicine to help prevent HIV infection.  What else can I do?  Schedule regular health, dental, and eye exams.  Stay current with your vaccines (immunizations).  Do not use any tobacco products, such as cigarettes, chewing tobacco, and e-cigarettes. If you need help quitting, ask  your health care provider.  Limit alcohol intake to no more than 2 drinks per day. One drink equals 12 ounces of beer, 5 ounces of wine, or 1 ounces of hard liquor.  Do not use street drugs.  Do not share needles.  Ask your health care provider for help if you need support or information about quitting drugs.  Tell your health care provider if you often feel depressed.  Tell your health care provider if you have ever been abused or do not feel safe at home. This information is not intended to replace advice given to you by your health care provider. Make sure you discuss any questions you have with your health care provider. Document Released: 09/26/2007 Document Revised: 11/27/2015 Document Reviewed: 01/01/2015 Elsevier Interactive Patient Education  [employer].

## 2017-05-24 NOTE — Assessment & Plan Note (Signed)
Discussed healthy diet and lifestyle choices. Weight loss noted with healthier diet changes over the past 4 months. Sustainable. Pt motivated.

## 2017-05-24 NOTE — Assessment & Plan Note (Signed)
Chronic, stable on omeprazole 20mg  daily. Breakthrough sxs if skips dose.

## 2017-05-24 NOTE — Progress Notes (Signed)
BP 120/78 (BP Location: Left Arm, Patient Position: Sitting, Cuff Size: Normal)   Pulse 65   Temp 98.2 F (36.8 C) (Oral)   Ht 6' (1.829 m)   Wt 226 lb (102.5 kg)   SpO2 95%   BMI 30.65 kg/m    CC: CPE Subjective:    Patient ID: Phillip Gomez, male    DOB: 06/13/1965, 52 y.o.   MRN: [id number]  HPI: Phillip Gomez is a 52 y.o. male presenting on 05/24/2017 for Annual Exam (Had colonoscopy end of Nov 2018 via [facility]. Will bring a copy of results.)   ADD - tolerating methylphenidate well. Denies headache, insomnia, loss of appetite, or chest pain. Does not do stimulant holidays.   Preventative: Colon cancer screening - colonsocopy 02/2017 - 3 polyps, rpt 5 yrs (through [facility]) Prostate cancer screening - discussed, would like to screen Flu shot yearly Tdap 2014 Seat belt use discussed Sunscreen use discussed. No changing moles on skin. 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 Retired from [employer] Activity: stays active at work  Diet: good water, fruits/vegetables some   Relevant past medical, surgical, family and social history reviewed and updated as indicated. Interim medical history since our last visit reviewed. Allergies and medications reviewed and updated. Outpatient Medications Prior to Visit  Medication Sig Dispense Refill  . FLUoxetine (PROZAC) 10 MG capsule TAKE 1 CAPSULE DAILY (TAKE WITH 20 MG CAPSULE) 90 capsule 1  . FLUoxetine (PROZAC) 20 MG capsule TAKE 1 CAPSULE DAILY (TAKE WITH 10 MG CAPSULE) 90 capsule 1  . levothyroxine (SYNTHROID, LEVOTHROID) 137 MCG tablet TAKE 1 TABLET DAILY BEFORE BREAKFAST 90 tablet 0  . methylphenidate 27 MG PO CR tablet Take 1 tablet (27 mg total) by mouth daily. 30 tablet 0  . omeprazole (PRILOSEC) 20 MG capsule TAKE 1 CAPSULE DAILY 90 capsule 3  . NONFORMULARY OR COMPOUNDED ITEM Allergy injections     No facility-administered  medications prior to visit.      Per HPI unless specifically indicated in ROS section below Review of Systems  Constitutional: Negative for activity change, appetite change, chills, fatigue, fever and unexpected weight change.  HENT: Negative for hearing loss.   Eyes: Negative for visual disturbance.  Respiratory: Negative for cough, chest tightness, shortness of breath and wheezing.   Cardiovascular: Negative for chest pain, palpitations and leg swelling.  Gastrointestinal: Negative for abdominal distention, abdominal pain, blood in stool, constipation, diarrhea, nausea and vomiting.  Genitourinary: Negative for difficulty urinating and hematuria.  Musculoskeletal: Negative for arthralgias, myalgias and neck pain.  Skin: Negative for rash.  Neurological: Negative for dizziness, seizures, syncope and headaches.  Hematological: Negative for adenopathy. Does not bruise/bleed easily.  Psychiatric/Behavioral: Negative for dysphoric mood. The patient is not nervous/anxious.        Objective:    BP 120/78 (BP Location: Left Arm, Patient Position: Sitting, Cuff Size: Normal)   Pulse 65   Temp 98.2 F (36.8 C) (Oral)   Ht 6' (1.829 m)   Wt 226 lb (102.5 kg)   SpO2 95%   BMI 30.65 kg/m   Wt Readings from Last 3 Encounters:  05/24/17 226 lb (102.5 kg)  09/30/16 236 lb (107 kg)  04/22/16 235 lb (106.6 kg)    Physical Exam  Constitutional: He is oriented to person, place, and time. He appears well-developed and well-nourished. No distress.  HENT:  Head: Normocephalic and atraumatic.  Right Ear:  Hearing, tympanic membrane, external ear and ear canal normal.  Left Ear: Hearing, tympanic membrane, external ear and ear canal normal.  Nose: Nose normal.  Mouth/Throat: Uvula is midline, oropharynx is clear and moist and mucous membranes are normal. No oropharyngeal exudate, posterior oropharyngeal edema or posterior oropharyngeal erythema.  Eyes: Conjunctivae and EOM are normal. Pupils are  equal, round, and reactive to light. No scleral icterus.  Neck: Normal range of motion. Neck supple. No thyromegaly present.  Cardiovascular: Normal rate, regular rhythm, normal heart sounds and intact distal pulses.  No murmur heard. Pulses:      Radial pulses are 2+ on the right side, and 2+ on the left side.  Pulmonary/Chest: Effort normal and breath sounds normal. No respiratory distress. He has no wheezes. He has no rales.  Abdominal: Soft. Bowel sounds are normal. He exhibits no distension and no mass. There is no tenderness. There is no rebound and no guarding.  Genitourinary: Rectum normal and prostate normal. Rectal exam shows no external hemorrhoid, no internal hemorrhoid, no fissure, no mass, no tenderness and anal tone normal. Prostate is not enlarged (20gm) and not tender.  Musculoskeletal: Normal range of motion. He exhibits no edema.  Lymphadenopathy:    He has no cervical adenopathy.  Neurological: He is alert and oriented to person, place, and time.  CN grossly intact, station and gait intact  Skin: Skin is warm and dry. No rash noted.  Psychiatric: He has a normal mood and affect. His behavior is normal. Judgment and thought content normal.  Nursing note and vitals reviewed.  Results for orders placed or performed in visit on 05/20/17  Lipid panel  Result Value Ref Range   Cholesterol 152 <200 mg/dL   HDL 39 (L) >16 mg/dL   Triglycerides 109 <604 mg/dL   LDL Cholesterol (Calc) 90 mg/dL (calc)   Total CHOL/HDL Ratio 3.9 <5.0 (calc)   Non-HDL Cholesterol (Calc) 113 <130 mg/dL (calc)  Basic metabolic panel  Result Value Ref Range   Glucose, Bld 96 65 - 99 mg/dL   BUN 29 (H) 7 - 25 mg/dL   Creat 5.40 9.81 - 1.91 mg/dL   BUN/Creatinine Ratio 29 (H) 6 - 22 (calc)   Sodium 140 135 - 146 mmol/L   Potassium 4.6 3.5 - 5.3 mmol/L   Chloride 104 98 - 110 mmol/L   CO2 25 20 - 32 mmol/L   Calcium 9.2 8.6 - 10.3 mg/dL  TSH  Result Value Ref Range   TSH 3.20 0.40 - 4.50  mIU/L  Testos,Total,Free and SHBG (Male)  Result Value Ref Range   Sex Hormone Binding 26 10 - 50 nmol/L      Assessment & Plan:   Problem List Items Addressed This Visit    ADD (attention deficit disorder)    Chronic, stable. Continue current regimen. Update UDS today. Discussed stimulant holiday option.       Relevant Orders   Pain Mgmt, Profile 8 w/Conf, U   Anxiety    Chronic, stable on prozac 30mg  daily.       Dyslipidemia    Chronic, improved with healthier diet changes over the past 4 months. Sustainable.  The 10-year ASCVD risk score Denman George DC Jr., et al., 2013) is: 2.7%   Values used to calculate the score:     Age: 66 years     Sex: Male     Is Non-Hispanic African American: No     Diabetic: No     Tobacco smoker: No  Systolic Blood Pressure: 120 mmHg     Is BP treated: No     HDL Cholesterol: 39 mg/dL     Total Cholesterol: 152 mg/dL       GERD (gastroesophageal reflux disease)    Chronic, stable on omeprazole 20mg  daily. Breakthrough sxs if skips dose.      Health maintenance examination - Primary    Preventative protocols reviewed and updated unless pt declined. Discussed healthy diet and lifestyle.       Hypothyroidism    Chronic, stable. Continue current regimen.       Kidney lesion, native, left    Discussed with patient. Slightly complicated L renal cyst with periph calcifications. Will check renal US to further evaluate.       Relevant Orders   US Renal   Obesity, Class I, BMI 30.0-34.9 (see actual BMI)    Discussed healthy diet and lifestyle choices. Weight loss noted with healthier diet changes over the past 4 months. Sustainable. Pt motivated.           Follow up plan: Return in about 1 year (around 05/24/2018) for annual exam, prior fasting for blood work.  Eustaquio Boyden, MD

## 2017-05-24 NOTE — Assessment & Plan Note (Signed)
Chronic, stable on prozac 30mg  daily.

## 2017-05-24 NOTE — Assessment & Plan Note (Signed)
Chronic, stable. Continue current regimen. Update UDS today. Discussed stimulant holiday option.

## 2017-05-24 NOTE — Assessment & Plan Note (Addendum)
Discussed with patient. Slightly complicated L renal cyst with periph calcifications. Will check renal US to further evaluate.

## 2017-05-25 LAB — TESTOS,TOTAL,FREE AND SHBG (FEMALE)
Free Testosterone: 63.9 pg/mL (ref 35.0–155.0)
Sex Hormone Binding: 26 nmol/L (ref 10–50)
TESTOSTERONE, TOTAL, LC-MS-MS: 387 ng/dL (ref 250–1100)

## 2017-05-25 LAB — LIPID PANEL
CHOLESTEROL: 152 mg/dL (ref <200)
HDL: 39 mg/dL — AB (ref >40)
LDL Cholesterol (Calc): 90 mg/dL (calc)
Non-HDL Cholesterol (Calc): 113 mg/dL (calc) (ref <130)
TRIGLYCERIDES: 136 mg/dL (ref <150)
Total CHOL/HDL Ratio: 3.9 (calc) (ref <5.0)

## 2017-05-25 LAB — BASIC METABOLIC PANEL
BUN / CREAT RATIO: 29 (calc) — AB (ref 6–22)
BUN: 29 mg/dL — ABNORMAL HIGH (ref 7–25)
CHLORIDE: 104 mmol/L (ref 98–110)
CO2: 25 mmol/L (ref 20–32)
CREATININE: 0.99 mg/dL (ref 0.70–1.33)
Calcium: 9.2 mg/dL (ref 8.6–10.3)
Glucose, Bld: 96 mg/dL (ref 65–99)
Potassium: 4.6 mmol/L (ref 3.5–5.3)
Sodium: 140 mmol/L (ref 135–146)

## 2017-05-25 LAB — TSH: TSH: 3.2 mIU/L (ref 0.40–4.50)

## 2017-05-26 LAB — PAIN MGMT, PROFILE 8 W/CONF, U
6 ACETYLMORPHINE: NEGATIVE ng/mL (ref <10)
AMPHETAMINES: NEGATIVE ng/mL (ref <500)
Alcohol Metabolites: NEGATIVE ng/mL (ref <500)
Benzodiazepines: NEGATIVE ng/mL (ref <100)
Buprenorphine, Urine: NEGATIVE ng/mL (ref <5)
Cocaine Metabolite: NEGATIVE ng/mL (ref <150)
Creatinine: 193.5 mg/dL
MARIJUANA METABOLITE: NEGATIVE ng/mL (ref <20)
MDMA: NEGATIVE ng/mL (ref <500)
OPIATES: NEGATIVE ng/mL (ref <100)
Oxidant: NEGATIVE ug/mL (ref <200)
Oxycodone: NEGATIVE ng/mL (ref <100)
PH: 7.49 (ref 4.5–9.0)

## 2017-06-02 ENCOUNTER — Ambulatory Visit
Admission: RE | Admit: 2017-06-02 | Discharge: 2017-06-02 | Disposition: A | Discharge status: Home / Self Care | Source: Ambulatory Visit | Attending: Family Medicine | Admitting: Family Medicine

## 2017-06-02 DIAGNOSIS — N281 Cyst of kidney, acquired: Secondary | ICD-10-CM | POA: Insufficient documentation

## 2017-06-02 DIAGNOSIS — N289 Disorder of kidney and ureter, unspecified: Secondary | ICD-10-CM

## 2017-06-02 DIAGNOSIS — N2889 Other specified disorders of kidney and ureter: Secondary | ICD-10-CM | POA: Diagnosis not present

## 2017-06-11 ENCOUNTER — Other Ambulatory Visit: Payer: Self-pay | Admitting: Family Medicine

## 2017-06-14 ENCOUNTER — Other Ambulatory Visit: Payer: Self-pay | Admitting: Family Medicine

## 2017-06-14 NOTE — Telephone Encounter (Signed)
Methylphenidate 27mg  PO CR tablet refill  LOV: 05/24/17  Dr. Haywood FillerGutierrez  [facility] [address] [facility] [place]

## 2017-06-14 NOTE — Telephone Encounter (Signed)
Copied from CRM [phone]. Topic: Quick Communication - See Telephone Encounter >> Jun 14, 2017 11:17 AM Windy KalataMichael, Itzae Mccurdy L, NT wrote: CRM for notification. See Telephone encounter for:  06/14/17.  Patient is calling and is needing a refill on methylphenidate 27 MG PO CR tablet. Patient states last month it was sent to the wrong pharmacy and [facility]  [facility] [address] - [place], [place] - [address]  [address] [place] [id number]  Phone: [phone] Fax: [phone]

## 2017-06-16 NOTE — Telephone Encounter (Signed)
Pt called to f/u on RX. He is leaving town on Sunday and needs RX sent before he leaves. [facility] sent to [facility] [address] Notify pt when sent.  Call back [phone]

## 2017-06-17 MED ORDER — METHYLPHENIDATE HCL ER (OSM) 27 MG PO TBCR: 27.0000 mg | EXTENDED_RELEASE_TABLET | Freq: Every day | ORAL | 0 refills | Status: DC

## 2017-06-17 NOTE — Telephone Encounter (Signed)
E prescribed. plz notify patient.  

## 2017-06-17 NOTE — Telephone Encounter (Signed)
Spoke with pt notifying him rx was sent to [facility] [address] electronically.

## 2017-06-18 ENCOUNTER — Telehealth: Payer: Self-pay

## 2017-06-18 NOTE — Telephone Encounter (Signed)
Patient's mother dropped off patient's colonoscopy report from [facility] to have on file. Placed for review for provider.-Crystal Ellwood [employer] Rossy Virag, RMA

## 2017-06-21 NOTE — Telephone Encounter (Signed)
noted 

## 2017-07-15 ENCOUNTER — Encounter: Payer: Self-pay | Admitting: Family Medicine

## 2017-07-15 DIAGNOSIS — N289 Disorder of kidney and ureter, unspecified: Secondary | ICD-10-CM

## 2017-07-15 DIAGNOSIS — N281 Cyst of kidney, acquired: Secondary | ICD-10-CM

## 2017-07-18 ENCOUNTER — Other Ambulatory Visit: Payer: Self-pay | Admitting: Family Medicine

## 2017-07-19 NOTE — Telephone Encounter (Signed)
Last rx:  06/17/17, #30 Last OV (CPE):  05/24/17 Next OV:  none

## 2017-07-19 NOTE — Addendum Note (Signed)
Addended by: Eustaquio BoydenGUTIERREZ, Feiga Nadel on: 07/19/2017 02:33 PM   Modules accepted: Orders

## 2017-07-21 MED ORDER — METHYLPHENIDATE HCL ER (OSM) 27 MG PO TBCR: 27.0000 mg | EXTENDED_RELEASE_TABLET | Freq: Every day | ORAL | 0 refills | Status: DC

## 2017-07-21 NOTE — Telephone Encounter (Signed)
Eprescribed.

## 2017-08-04 ENCOUNTER — Ambulatory Visit
Admission: RE | Admit: 2017-08-04 | Discharge: 2017-08-04 | Disposition: A | Discharge status: Home / Self Care | Source: Ambulatory Visit | Attending: Family Medicine | Admitting: Family Medicine

## 2017-08-04 DIAGNOSIS — N281 Cyst of kidney, acquired: Secondary | ICD-10-CM

## 2017-08-04 DIAGNOSIS — J984 Other disorders of lung: Secondary | ICD-10-CM | POA: Diagnosis not present

## 2017-08-04 DIAGNOSIS — N289 Disorder of kidney and ureter, unspecified: Secondary | ICD-10-CM | POA: Insufficient documentation

## 2017-08-04 MED ORDER — GADOBENATE DIMEGLUMINE 529 MG/ML IV SOLN
20.0000 mL | Freq: Once | INTRAVENOUS | Status: AC | PRN
Start: 2017-08-04 — End: 2017-08-04
  Administered 2017-08-04: 20 mL via INTRAVENOUS

## 2017-08-17 ENCOUNTER — Other Ambulatory Visit: Payer: Self-pay | Admitting: Family Medicine

## 2017-08-18 MED ORDER — METHYLPHENIDATE HCL ER (OSM) 27 MG PO TBCR: 27.0000 mg | EXTENDED_RELEASE_TABLET | Freq: Every day | ORAL | 0 refills | Status: DC

## 2017-08-18 NOTE — Telephone Encounter (Signed)
Sent. Thanks.   

## 2017-08-18 NOTE — Telephone Encounter (Signed)
Last written 07-21-17 #30 Last OV/CPE 05-24-17 No Future OV  Forward to Dr Para March in Dr Timoteo Expose absence

## 2017-08-22 ENCOUNTER — Other Ambulatory Visit: Payer: Self-pay | Admitting: Family Medicine

## 2017-08-23 ENCOUNTER — Encounter: Payer: Self-pay | Admitting: Family Medicine

## 2017-08-30 ENCOUNTER — Other Ambulatory Visit: Payer: Self-pay | Admitting: Family Medicine

## 2017-08-30 MED ORDER — LEVOTHYROXINE SODIUM 137 MCG PO TABS
137.0000 ug | ORAL_TABLET | Freq: Every day | ORAL | 2 refills | Status: DC
Start: 2017-08-30 — End: 2018-06-15

## 2017-09-23 ENCOUNTER — Other Ambulatory Visit: Payer: Self-pay | Admitting: Family Medicine

## 2017-09-23 NOTE — Telephone Encounter (Signed)
Name of Medication: Methylphenidate   [facility] [address] [place] or [place] Date and Quantity:  30 tablet 0 08/18/2017  Last Office Visit and Type: 05/24/17 CPE Next Office Visit and Type: None Last Controlled Substance Agreement Date: 07/13/14 Last UDS: 04/22/16

## 2017-09-24 MED ORDER — METHYLPHENIDATE HCL ER (OSM) 27 MG PO TBCR: 27.0000 mg | EXTENDED_RELEASE_TABLET | Freq: Every day | ORAL | 0 refills | Status: DC

## 2017-09-24 NOTE — Telephone Encounter (Signed)
Eprescribed.

## 2017-09-27 ENCOUNTER — Encounter: Payer: Self-pay | Admitting: Family Medicine

## 2017-09-28 MED ORDER — METHYLPHENIDATE HCL ER (OSM) 27 MG PO TBCR: 27.0000 mg | EXTENDED_RELEASE_TABLET | Freq: Every day | ORAL | 0 refills | Status: DC

## 2017-09-28 NOTE — Telephone Encounter (Signed)
[facility] [facility]  Confirms rx was received and they are working on it.

## 2017-09-28 NOTE — Telephone Encounter (Signed)
Can we call pharmacy to verify above?  I've sent in new Rx for methylphenidate.

## 2017-09-29 NOTE — Telephone Encounter (Signed)
See pt email - did you confirm that they only provided pt with 5 days worth initially?

## 2017-09-30 NOTE — Telephone Encounter (Signed)
[facility] [address]  States pt picked up full 30-day rx for $11.00.  Says she does not see where he was only provided 5 days worth with previous rx.

## 2017-10-25 ENCOUNTER — Other Ambulatory Visit: Payer: Self-pay | Admitting: Family Medicine

## 2017-10-26 NOTE — Telephone Encounter (Signed)
Name of Medication: methylphenidate 27 mg CR [facility] [address]  Last Fill or Written Date and Quantity: # 30 on 09/28/17 Last Office Visit and Type: 05/24/17 annual Next Office Visit and Type: none Last Controlled Substance Agreement Date: 05/24/17 Last UDS:05/24/17

## 2017-10-28 MED ORDER — METHYLPHENIDATE HCL ER (OSM) 27 MG PO TBCR: 27.0000 mg | EXTENDED_RELEASE_TABLET | Freq: Every day | ORAL | 0 refills | Status: DC

## 2017-10-28 NOTE — Telephone Encounter (Signed)
Eprescribed.

## 2017-11-22 ENCOUNTER — Other Ambulatory Visit: Payer: Self-pay | Admitting: Family Medicine

## 2017-11-23 NOTE — Telephone Encounter (Signed)
Name of Medication: methylphenidate 27 mg CR tab Name of Pharmacy: [facility] [address] Last Fill or Written Date and Quantity: #30 on 10/28/17 Last Office Visit and Type: 05/24/17 annual Next Office Visit and Type: none Last Controlled Substance Agreement Date: 05/24/17 Last UDS:05/24/17

## 2017-11-24 MED ORDER — METHYLPHENIDATE HCL ER (OSM) 27 MG PO TBCR: 27.0000 mg | EXTENDED_RELEASE_TABLET | Freq: Every day | ORAL | 0 refills | Status: DC

## 2017-11-24 NOTE — Telephone Encounter (Signed)
Eprescribed.

## 2017-11-26 ENCOUNTER — Encounter: Payer: Self-pay | Admitting: Emergency Medicine

## 2017-11-26 ENCOUNTER — Emergency Department
Admission: EM | Admit: 2017-11-26 | Discharge: 2017-11-26 | Disposition: A | Discharge status: Home / Self Care | Attending: Emergency Medicine | Admitting: Emergency Medicine

## 2017-11-26 ENCOUNTER — Other Ambulatory Visit: Payer: Self-pay

## 2017-11-26 DIAGNOSIS — Y999 Unspecified external cause status: Secondary | ICD-10-CM | POA: Diagnosis not present

## 2017-11-26 DIAGNOSIS — Y9389 Activity, other specified: Secondary | ICD-10-CM | POA: Diagnosis not present

## 2017-11-26 DIAGNOSIS — Z79899 Other long term (current) drug therapy: Secondary | ICD-10-CM | POA: Diagnosis not present

## 2017-11-26 DIAGNOSIS — S31110A Laceration without foreign body of abdominal wall, right upper quadrant without penetration into peritoneal cavity, initial encounter: Secondary | ICD-10-CM | POA: Diagnosis not present

## 2017-11-26 DIAGNOSIS — S31119A Laceration without foreign body of abdominal wall, unspecified quadrant without penetration into peritoneal cavity, initial encounter: Secondary | ICD-10-CM

## 2017-11-26 DIAGNOSIS — W260XXA Contact with knife, initial encounter: Secondary | ICD-10-CM | POA: Diagnosis not present

## 2017-11-26 DIAGNOSIS — E039 Hypothyroidism, unspecified: Secondary | ICD-10-CM | POA: Diagnosis not present

## 2017-11-26 DIAGNOSIS — S3991XA Unspecified injury of abdomen, initial encounter: Secondary | ICD-10-CM | POA: Diagnosis present

## 2017-11-26 DIAGNOSIS — Z23 Encounter for immunization: Secondary | ICD-10-CM | POA: Insufficient documentation

## 2017-11-26 DIAGNOSIS — Y929 Unspecified place or not applicable: Secondary | ICD-10-CM | POA: Insufficient documentation

## 2017-11-26 MED ORDER — TETANUS-DIPHTH-ACELL PERTUSSIS 5-2.5-18.5 LF-MCG/0.5 IM SUSP
0.5000 mL | Freq: Once | INTRAMUSCULAR | Status: AC
  Administered 2017-11-26: 0.5 mL via INTRAMUSCULAR
  Filled 2017-11-26: qty 0.5

## 2017-11-26 MED ORDER — LIDOCAINE-EPINEPHRINE 1 %-1:100000 IJ SOLN
30.0000 mL | Freq: Once | INTRAMUSCULAR | Status: AC
  Administered 2017-11-26: 30 mL via INTRADERMAL

## 2017-11-26 MED ORDER — LIDOCAINE-EPINEPHRINE 1 %-1:100000 IJ SOLN
INTRAMUSCULAR | Status: AC
  Administered 2017-11-26: 30 mL via INTRADERMAL
  Filled 2017-11-26: qty 1

## 2017-11-26 MED ORDER — BACITRACIN ZINC 500 UNIT/GM EX OINT
1.0000 "application " | TOPICAL_OINTMENT | Freq: Once | CUTANEOUS | Status: AC
  Administered 2017-11-26: 1 via TOPICAL
  Filled 2017-11-26: qty 0.9

## 2017-11-26 MED ORDER — LIDOCAINE HCL (PF) 1 % IJ SOLN: 5.0000 mL | Freq: Once | INTRAMUSCULAR | Status: DC

## 2017-11-26 NOTE — ED Notes (Signed)
See triage note  Presents with laceration to right side of abd..states his box cutter slipped and hit his abd

## 2017-11-26 NOTE — ED Provider Notes (Signed)
[facility] Emergency Department Provider Note  ____________________________________________  Time seen: Approximately 2:59 PM  I have reviewed the triage vital signs and the nursing notes.   HISTORY  Chief Complaint Laceration   HPI Phillip Gomez is a 52 y.o. male presents to the emergency department for treatment and evaluation of a laceration to the right side of his abdomen.  He owns his own business, but while at work he accidentally sliced himself with a box cutter.  He applied a bandage, but states that the wound has continued to bleed.  Incident occurred at approximately 10 AM this morning.  Tdap is not up-to-date.   Past Medical History:  Diagnosis Date  . ADHD (attention deficit hyperactivity disorder) 2005  . Anxiety   . Diverticulosis 2018   by CT  . GERD (gastroesophageal reflux disease)    with HH  . Hepatic steatosis 2018   by CT  . History of chicken pox   . Hypothyroidism 2012    Patient Active Problem List   Diagnosis Date Noted  . Obesity, Class I, BMI 30.0-34.9 (see actual BMI) 05/24/2017  . Kidney lesion, native, left 09/30/2016  . Health maintenance examination 04/22/2016  . Dyslipidemia 04/13/2016  . Plantar fasciitis 10/27/2015  . Hypogonadism in male 02/19/2015  . Chronic fatigue 02/07/2015  . Hypothyroidism   . ADD (attention deficit disorder)   . Anxiety   . GERD (gastroesophageal reflux disease)     Past Surgical History:  Procedure Laterality Date  . COLONOSCOPY  02/2017   3mm TA sigmoid colon, rpt 5 yrs (at [facility])  . FL BARIUM SWALLOW ([facility] HX)     HH, GERD  . LASIK  1994  . TONSILLECTOMY  [id number]  . WISDOM TOOTH EXTRACTION  1990    Prior to Admission medications   Medication Sig Start Date End Date Taking? Authorizing Provider  FLUoxetine (PROZAC) 10 MG capsule TAKE 1 CAPSULE DAILY (TAKE WITH 20 MG CAPSULE) 06/11/17   Eustaquio BoydenGutierrez, Javier, MD  FLUoxetine (PROZAC) 20 MG capsule TAKE 1 CAPSULE DAILY (TAKE WITH 10 MG  CAPSULE) 06/11/17   Eustaquio BoydenGutierrez, Javier, MD  levothyroxine (SYNTHROID, LEVOTHROID) 137 MCG tablet Take 1 tablet (137 mcg total) by mouth daily before breakfast. 08/30/17   Eustaquio BoydenGutierrez, Javier, MD  methylphenidate 27 MG PO CR tablet Take 1 tablet (27 mg total) by mouth daily. 11/24/17   Eustaquio BoydenGutierrez, Javier, MD  omeprazole (PRILOSEC) 20 MG capsule TAKE 1 CAPSULE DAILY 04/19/17   Eustaquio BoydenGutierrez, Javier, MD    Allergies Other  Family History  Problem Relation Age of Onset  . CAD Father [id number]       MI  . Diabetes Brother   . Cancer Sister        leukemia (half-sister)  . Stroke Neg Hx   . Hypertension Neg Hx     Social History Social History   Tobacco Use  . Smoking status: Never Smoker  . Smokeless tobacco: Never Used  Substance Use Topics  . Alcohol use: No    Alcohol/week: 0.0 standard drinks  . Drug use: No    Review of Systems  Constitutional: Negative for fever. Respiratory: Negative for cough or shortness of breath.  Musculoskeletal: Negative for myalgias Skin: Positive for laceration Neurological: Negative for numbness or paresthesias. ____________________________________________   PHYSICAL EXAM:  VITAL SIGNS: ED Triage Vitals  Enc Vitals Group     BP 11/26/17 1341 128/88     Pulse Rate 11/26/17 1341 62     Resp 11/26/17 1341 18  Temp 11/26/17 1341 98.4 F (36.9 C)     Temp Source 11/26/17 1341 Oral     SpO2 11/26/17 1341 96 %     Weight 11/26/17 1341 225 lb (102.1 kg)     Height 11/26/17 1341 6' (1.829 m)     Head Circumference --      Peak Flow --      Pain Score 11/26/17 1337 8     Pain Loc --      Pain Edu? --      Excl. in GC? --      Constitutional: Well appearing. Eyes: Conjunctivae are clear without discharge or drainage. Nose: No rhinorrhea noted. Mouth/Throat: Airway is patent.  Neck: No stridor. Unrestricted range of motion observed. Cardiovascular: Capillary refill is <3 seconds.  Respiratory: Respirations are even and unlabored.. Musculoskeletal:  Unrestricted range of motion observed. Neurologic: Awake, alert, and oriented x 4.  Skin: 3 cm laceration to the abdomen  ____________________________________________   LABS (all labs ordered are listed, but only abnormal results are displayed)  Labs Reviewed - No data to display ____________________________________________  EKG  Not indicated. ____________________________________________  RADIOLOGY  Not indicated. ____________________________________________   PROCEDURES  .Marland KitchenLaceration Repair Date/Time: 11/26/2017 3:05 PM Performed by: Chinita Pester, FNP Authorized by: Chinita Pester, FNP   Consent:    Consent obtained:  Verbal   Consent given by:  Patient   Risks discussed:  Poor cosmetic result Anesthesia (see MAR for exact dosages):    Anesthesia method:  Local infiltration   Local anesthetic:  Lidocaine 1% w/o epi Laceration details:    Location:  Trunk   Trunk location:  RUQ abd   Length (cm):  3 Repair type:    Repair type:  Intermediate Pre-procedure details:    Preparation:  Patient was prepped and draped in usual sterile fashion Exploration:    Contaminated: no   Treatment:    Area cleansed with:  Betadine and saline   Amount of cleaning:  Standard   Irrigation method:  Syringe   Visualized foreign bodies/material removed: no   Subcutaneous repair:    Suture size:  5-0   Suture material:  Vicryl   Number of sutures:  2 Skin repair:    Repair method:  Sutures   Suture size:  5-0   Suture material:  Nylon   Suture technique:  Simple interrupted   Number of sutures:  4 Post-procedure details:    Dressing:  Antibiotic ointment   Patient tolerance of procedure:  Tolerated well, no immediate complications   ____________________________________________   INITIAL IMPRESSION / ASSESSMENT AND PLAN / ED COURSE  [name] is a 52 y.o. male who presents to the emergency department for treatment and evaluation of laceration to the abdomen.     Sutures were placed and tetanus booster was given prior to discharge.  Wound care instructions were provided.  Patient will follow-up with his primary care provider or return to the urgent care/ER in 10 to 12 days for suture removal.  Medications  lidocaine-EPINEPHrine (XYLOCAINE W/EPI) 1 %-1:100000 (with pres) injection 30 mL (30 mLs Intradermal Given by Other 11/26/17 1500)  bacitracin ointment 1 application (1 application Topical Given 11/26/17 1442)  Tdap (BOOSTRIX) injection 0.5 mL (0.5 mLs Intramuscular Given 11/26/17 1451)     Pertinent labs & imaging results that were available during my care of the patient were reviewed by me and considered in my medical decision making (see chart for details).  ____________________________________________   FINAL CLINICAL IMPRESSION(S) /  ED DIAGNOSES  Final diagnoses:  Laceration of abdomen, initial encounter    ED Discharge Orders    None       Note:  This document was prepared using Dragon voice recognition software and may include unintentional dictation errors.    Chinita Pesterriplett, Neyda Durango B, FNP 11/26/17 1509    Dionne BucySiadecki, Sebastian, MD 11/26/17 1524

## 2017-11-26 NOTE — Discharge Instructions (Signed)
Do not get the sutured area wet for 24 hours. After 24 hours, shower/bathe as usual and pat the area dry. °Change the bandage 2 times per day and apply antibiotic ointment. °Leave open to air when at no risk of getting the area dirty, but cover at night before bed. °See your PCP or go to Urgent Care in 10 days for suture removal or sooner for signs or concern of infection. ° °

## 2017-11-26 NOTE — ED Triage Notes (Signed)
Lac to right side abd when his box cutter slipped while cutting a gas can in half.

## 2017-12-07 ENCOUNTER — Encounter: Payer: Self-pay | Admitting: Family Medicine

## 2017-12-07 ENCOUNTER — Ambulatory Visit (INDEPENDENT_AMBULATORY_CARE_PROVIDER_SITE_OTHER): Admitting: Family Medicine

## 2017-12-07 VITALS — BP 118/82 | HR 63 | Temp 98.3°F | Ht 72.0 in | Wt 235.0 lb

## 2017-12-07 DIAGNOSIS — S31119A Laceration without foreign body of abdominal wall, unspecified quadrant without penetration into peritoneal cavity, initial encounter: Secondary | ICD-10-CM | POA: Diagnosis not present

## 2017-12-07 DIAGNOSIS — H6122 Impacted cerumen, left ear: Secondary | ICD-10-CM | POA: Diagnosis not present

## 2017-12-07 DIAGNOSIS — H6120 Impacted cerumen, unspecified ear: Secondary | ICD-10-CM | POA: Insufficient documentation

## 2017-12-07 NOTE — Assessment & Plan Note (Signed)
Sutures removed. Pt tolerated well.

## 2017-12-07 NOTE — Assessment & Plan Note (Signed)
L ear cleared successfully with alligator forceps.

## 2017-12-07 NOTE — Progress Notes (Signed)
BP 118/82 (BP Location: Left Arm, Patient Position: Sitting, Cuff Size: Large)   Pulse 63   Temp 98.3 F (36.8 C) (Oral)   Ht 6' (1.829 m)   Wt 235 lb (106.6 kg)   SpO2 97%   BMI 31.87 kg/m    CC: ER f/u visit Subjective:    Patient ID: Phillip Gomez, male    DOB: 01/24/1966, 52 y.o.   MRN: [id number]  HPI: Phillip Gomez is a 52 y.o. male presenting on 12/07/2017 for Hospitalization Follow-up ([facility], primary dx laceration of abd.)   Recent ER visit for laceration ot R side of abdomen - self sustained while working with box cutter. ER note reviewed. Laceration repaired with 2 subcutaneous sutures and then 4 simple interrupted sutures. Tdap updated.   Here for suture removal.   Would like L ear checked - feels muffled. No ear pain. Was in pool 10 days ago. No hearing changes.    Relevant past medical, surgical, family and social history reviewed and updated as indicated. Interim medical history since our last visit reviewed. Allergies and medications reviewed and updated. Outpatient Medications Prior to Visit  Medication Sig Dispense Refill  . FLUoxetine (PROZAC) 10 MG capsule TAKE 1 CAPSULE DAILY (TAKE WITH 20 MG CAPSULE) 90 capsule 1  . FLUoxetine (PROZAC) 20 MG capsule TAKE 1 CAPSULE DAILY (TAKE WITH 10 MG CAPSULE) 90 capsule 1  . levothyroxine (SYNTHROID, LEVOTHROID) 137 MCG tablet Take 1 tablet (137 mcg total) by mouth daily before breakfast. 90 tablet 2  . methylphenidate 27 MG PO CR tablet Take 1 tablet (27 mg total) by mouth daily. 30 tablet 0  . omeprazole (PRILOSEC) 20 MG capsule TAKE 1 CAPSULE DAILY 90 capsule 3   No facility-administered medications prior to visit.      Per HPI unless specifically indicated in ROS section below Review of Systems     Objective:    BP 118/82 (BP Location: Left Arm, Patient Position: Sitting, Cuff Size: Large)   Pulse 63   Temp 98.3 F (36.8 C) (Oral)   Ht 6' (1.829 m)   Wt 235 lb (106.6 kg)   SpO2 97%   BMI  31.87 kg/m   Wt Readings from Last 3 Encounters:  12/07/17 235 lb (106.6 kg)  11/26/17 225 lb (102.1 kg)  05/24/17 226 lb (102.5 kg)    Physical Exam  Constitutional: He appears well-developed and well-nourished. No distress.  HENT:  Right Ear: Hearing, tympanic membrane, external ear and ear canal normal.  Left Ear: Hearing, tympanic membrane, external ear and ear canal normal.  Some cerumen in L canal successfully removed with alligator forceps and cotton tip applicator   Skin: Skin is warm and dry.  Repaired laceration R abdominal wall without surrounding erythema or drainage  Nursing note and vitals reviewed.  Suture removal: 4 sutures placed at ER, only 3 sutures remain - anticipate lateral suture has fallen off in interim. 3 sutures removed successfully, pt tolerated well. Skin not well approximated so steri strips applied. After care of wound reviewed.     Assessment & Plan:   Problem List Items Addressed This Visit    Laceration of abdominal wall - Primary    Sutures removed. Pt tolerated well.       Cerumen impaction    L ear cleared successfully with alligator forceps.           No orders of the defined types were placed in this encounter.  No orders  of the defined types were placed in this encounter.   Follow up plan: No follow-ups on file.  Eustaquio Boyden, MD

## 2017-12-09 ENCOUNTER — Encounter: Payer: Self-pay | Admitting: Family Medicine

## 2017-12-09 ENCOUNTER — Other Ambulatory Visit: Payer: Self-pay | Admitting: Family Medicine

## 2017-12-10 NOTE — Patient Instructions (Signed)
Sutures removed today  Suture Removal, Care After Refer to this sheet in the next few weeks. These instructions provide you with information on caring for yourself after your procedure. Your health care provider may also give you more specific instructions. Your treatment has been planned according to current medical practices, but problems sometimes occur. Call your health care provider if you have any problems or questions after your procedure. What can I expect after the procedure? After your stitches (sutures) are removed, it is typical to have the following:  Some discomfort and swelling in the wound area.  Slight redness in the area.  Follow these instructions at home:  If you have skin adhesive strips over the wound area, do not take the strips off. They will fall off on their own in a few days. If the strips remain in place after 14 days, you may remove them.  Change any bandages (dressings) at least once a day or as directed by your health care provider. If the bandage sticks, soak it off with warm, soapy water.  Apply cream or ointment only as directed by your health care provider. If using cream or ointment, wash the area with soap and water 2 times a day to remove all the cream or ointment. Rinse off the soap and pat the area dry with a clean towel.  Keep the wound area dry and clean. If the bandage becomes wet or dirty, or if it develops a bad smell, change it as soon as possible.  Continue to protect the wound from injury.  Use sunscreen when out in the sun. New scars become sunburned easily. Contact a health care provider if:  You have increasing redness, swelling, or pain in the wound.  You see pus coming from the wound.  You have a fever.  You notice a bad smell coming from the wound or dressing.  Your wound breaks open (edges not staying together). This information is not intended to replace advice given to you by your health care provider. Make sure you discuss  any questions you have with your health care provider. Document Released: 12/23/2000 Document Revised: 09/05/2015 Document Reviewed: 11/09/2012 Elsevier Interactive Patient Education  2017 [employer]

## 2017-12-22 ENCOUNTER — Other Ambulatory Visit: Payer: Self-pay | Admitting: Family Medicine

## 2017-12-22 NOTE — Telephone Encounter (Signed)
Name of Medication: Methylphenidate Name of Pharmacy: [facility] [address] Last Fill or Written Date and Quantity: 11/24/17, #30 Last Office Visit and Type: 12/07/17, acute Next Office Visit and Type: none Last Controlled Substance Agreement Date: 05/24/17 Last UDS: 05/24/17

## 2017-12-24 MED ORDER — METHYLPHENIDATE HCL ER (OSM) 27 MG PO TBCR: 27.0000 mg | EXTENDED_RELEASE_TABLET | Freq: Every day | ORAL | 0 refills | Status: DC

## 2017-12-24 NOTE — Telephone Encounter (Signed)
Eprescribed.

## 2018-01-24 ENCOUNTER — Other Ambulatory Visit: Payer: Self-pay | Admitting: Family Medicine

## 2018-01-24 NOTE — Telephone Encounter (Signed)
Last office visit 12/07/2017 for follow up abdominal wall laceration. No future appointments.   Last refilled 05/202/019 for #90 with 2 refills. UDS/Contract 05/24/2017.  Ok to refill?

## 2018-01-27 ENCOUNTER — Encounter: Payer: Self-pay | Admitting: Family Medicine

## 2018-01-27 MED ORDER — METHYLPHENIDATE HCL ER (OSM) 27 MG PO TBCR: 27.0000 mg | EXTENDED_RELEASE_TABLET | Freq: Every day | ORAL | 0 refills | Status: DC

## 2018-01-27 NOTE — Telephone Encounter (Signed)
Eprescribed.

## 2018-01-29 NOTE — Telephone Encounter (Signed)
This was refilled 10/17.

## 2018-02-22 ENCOUNTER — Other Ambulatory Visit: Payer: Self-pay | Admitting: Family Medicine

## 2018-02-22 NOTE — Telephone Encounter (Signed)
Name of Medication:  Methylphenidate Name of Pharmacy: [facility] [address] Last Fill or Written Date and Quantity: 01/27/18, #30/0 Last Office Visit and Type: 12/07/17, acute Next Office Visit and Type:  none Last Controlled Substance Agreement Date: 05/24/17 Last UDS: 05/24/17

## 2018-02-23 MED ORDER — METHYLPHENIDATE HCL ER (OSM) 27 MG PO TBCR: 27.0000 mg | EXTENDED_RELEASE_TABLET | Freq: Every day | ORAL | 0 refills | Status: DC

## 2018-02-23 NOTE — Telephone Encounter (Signed)
Eprescribed.

## 2018-03-23 ENCOUNTER — Other Ambulatory Visit: Payer: Self-pay | Admitting: Family Medicine

## 2018-03-23 NOTE — Telephone Encounter (Signed)
Last office visit 12/07/2017 for laceration of abdomin.  Last refilled 02/23/2018 for #30 with no refills.  No future appointments.  UDS/Contract 05/24/2017.

## 2018-03-24 MED ORDER — METHYLPHENIDATE HCL ER (OSM) 27 MG PO TBCR: 27.0000 mg | EXTENDED_RELEASE_TABLET | Freq: Every day | ORAL | 0 refills | Status: DC

## 2018-03-24 NOTE — Telephone Encounter (Signed)
Eprescribed.

## 2018-03-31 ENCOUNTER — Encounter: Payer: Self-pay | Admitting: Family Medicine

## 2018-03-31 ENCOUNTER — Other Ambulatory Visit: Payer: Self-pay | Admitting: Family Medicine

## 2018-03-31 DIAGNOSIS — N281 Cyst of kidney, acquired: Secondary | ICD-10-CM

## 2018-03-31 DIAGNOSIS — N289 Disorder of kidney and ureter, unspecified: Secondary | ICD-10-CM

## 2018-03-31 NOTE — Addendum Note (Signed)
Addended by: Eustaquio BoydenGUTIERREZ, Theron [place] on: 03/31/2018 04:43 PM   Modules accepted: Orders

## 2018-04-11 ENCOUNTER — Ambulatory Visit
Admission: RE | Admit: 2018-04-11 | Discharge: 2018-04-11 | Disposition: A | Discharge status: Home / Self Care | Source: Ambulatory Visit | Attending: Family Medicine | Admitting: Family Medicine

## 2018-04-11 DIAGNOSIS — N281 Cyst of kidney, acquired: Secondary | ICD-10-CM

## 2018-04-11 DIAGNOSIS — N289 Disorder of kidney and ureter, unspecified: Secondary | ICD-10-CM | POA: Diagnosis present

## 2018-04-11 MED ORDER — IOPAMIDOL (ISOVUE-300) INJECTION 61%
100.0000 mL | Freq: Once | INTRAVENOUS | Status: AC | PRN
  Administered 2018-04-11: 100 mL via INTRAVENOUS

## 2018-04-25 ENCOUNTER — Other Ambulatory Visit: Payer: Self-pay | Admitting: Family Medicine

## 2018-04-25 NOTE — Telephone Encounter (Signed)
Name of Medication: Methylphenidate Name of Pharmacy: [facility] Last Fill or Written Date and Quantity: 03/24/18, #30/0 Last Office Visit and Type: 12/07/17, acute Next Office Visit and Type: none Last Controlled Substance Agreement Date: 05/24/17 Last UDS: 05/24/17

## 2018-04-27 ENCOUNTER — Other Ambulatory Visit: Payer: Self-pay | Admitting: Family Medicine

## 2018-04-27 MED ORDER — METHYLPHENIDATE HCL ER (OSM) 27 MG PO TBCR: 27.0000 mg | EXTENDED_RELEASE_TABLET | Freq: Every day | ORAL | 0 refills | Status: DC

## 2018-04-27 NOTE — Telephone Encounter (Signed)
Patient advised, appt for CPE and lab work is made

## 2018-04-27 NOTE — Telephone Encounter (Signed)
E prescribed. Will be due for CPE next month.

## 2018-05-24 ENCOUNTER — Other Ambulatory Visit: Payer: Self-pay | Admitting: Family Medicine

## 2018-05-25 MED ORDER — METHYLPHENIDATE HCL ER (OSM) 27 MG PO TBCR: 27.0000 mg | EXTENDED_RELEASE_TABLET | Freq: Every day | ORAL | 0 refills | Status: DC

## 2018-05-25 NOTE — Telephone Encounter (Signed)
Name of Medication: Methylphenidate Name of Pharmacy: [facility] Last Fill or Written Date and Quantity: 04/27/2018, #30/0 Last Office Visit and Type: 12/07/17, acute Next Office Visit and Type: CPE for March Last Controlled Substance Agreement Date: 05/24/17 Last UDS: 05/24/17

## 2018-05-25 NOTE — Telephone Encounter (Signed)
I thought Dr Reece Agar was here- I saw him?

## 2018-05-25 NOTE — Telephone Encounter (Signed)
Eprescribed.

## 2018-06-07 ENCOUNTER — Encounter: Payer: Self-pay | Admitting: Family Medicine

## 2018-06-09 ENCOUNTER — Other Ambulatory Visit: Payer: Self-pay | Admitting: Family Medicine

## 2018-06-09 DIAGNOSIS — E291 Testicular hypofunction: Secondary | ICD-10-CM

## 2018-06-09 DIAGNOSIS — E785 Hyperlipidemia, unspecified: Secondary | ICD-10-CM

## 2018-06-09 DIAGNOSIS — Z125 Encounter for screening for malignant neoplasm of prostate: Secondary | ICD-10-CM

## 2018-06-09 DIAGNOSIS — E039 Hypothyroidism, unspecified: Secondary | ICD-10-CM

## 2018-06-10 ENCOUNTER — Other Ambulatory Visit (INDEPENDENT_AMBULATORY_CARE_PROVIDER_SITE_OTHER)

## 2018-06-10 ENCOUNTER — Other Ambulatory Visit: Payer: Self-pay

## 2018-06-10 DIAGNOSIS — E785 Hyperlipidemia, unspecified: Secondary | ICD-10-CM

## 2018-06-10 DIAGNOSIS — Z0000 Encounter for general adult medical examination without abnormal findings: Secondary | ICD-10-CM

## 2018-06-10 DIAGNOSIS — E039 Hypothyroidism, unspecified: Secondary | ICD-10-CM

## 2018-06-10 DIAGNOSIS — E291 Testicular hypofunction: Secondary | ICD-10-CM

## 2018-06-10 DIAGNOSIS — Z125 Encounter for screening for malignant neoplasm of prostate: Secondary | ICD-10-CM

## 2018-06-10 NOTE — Addendum Note (Signed)
Addended by: Alvina Chou on: 06/10/2018 09:45 AM   Modules accepted: Orders

## 2018-06-11 LAB — COMPREHENSIVE METABOLIC PANEL
ALBUMIN: 4.4 g/dL (ref 3.8–4.9)
ALT: 34 IU/L (ref 0–44)
AST: 26 IU/L (ref 0–40)
Albumin/Globulin Ratio: 1.7 (ref 1.2–2.2)
Alkaline Phosphatase: 84 IU/L (ref 39–117)
BUN/Creatinine Ratio: 26 — ABNORMAL HIGH (ref 9–20)
BUN: 28 mg/dL — ABNORMAL HIGH (ref 6–24)
Bilirubin Total: 0.4 mg/dL (ref 0.0–1.2)
CO2: 22 mmol/L (ref 20–29)
Calcium: 9.5 mg/dL (ref 8.7–10.2)
Chloride: 103 mmol/L (ref 96–106)
Creatinine, Ser: 1.07 mg/dL (ref 0.76–1.27)
GFR calc non Af Amer: 80 mL/min/{1.73_m2} (ref >59)
GFR, EST AFRICAN AMERICAN: 92 mL/min/{1.73_m2} (ref >59)
GLUCOSE: 102 mg/dL — AB (ref 65–99)
Globulin, Total: 2.6 g/dL (ref 1.5–4.5)
Potassium: 4.9 mmol/L (ref 3.5–5.2)
Sodium: 139 mmol/L (ref 134–144)
Total Protein: 7 g/dL (ref 6.0–8.5)

## 2018-06-11 LAB — LIPID PANEL
Chol/HDL Ratio: 4.2 ratio (ref 0.0–5.0)
Cholesterol, Total: 147 mg/dL (ref 100–199)
HDL: 35 mg/dL — AB (ref >39)
LDL Calculated: 93 mg/dL (ref 0–99)
Triglycerides: 97 mg/dL (ref 0–149)
VLDL Cholesterol Cal: 19 mg/dL (ref 5–40)

## 2018-06-11 LAB — TSH: TSH: 3.63 u[IU]/mL (ref 0.450–4.500)

## 2018-06-11 LAB — PSA: Prostate Specific Ag, Serum: 1.1 ng/mL (ref 0.0–4.0)

## 2018-06-11 LAB — T4, FREE: Free T4: 1.75 ng/dL (ref 0.82–1.77)

## 2018-06-15 ENCOUNTER — Other Ambulatory Visit: Payer: Self-pay | Admitting: Family Medicine

## 2018-06-16 ENCOUNTER — Encounter: Admitting: Family Medicine

## 2018-06-25 ENCOUNTER — Other Ambulatory Visit: Payer: Self-pay | Admitting: Family Medicine

## 2018-06-27 NOTE — Telephone Encounter (Signed)
Last filled 05-25-18 #30 Last OV Acute 12-07-17 Next OV 08-25-18 [facility] [address]

## 2018-06-28 ENCOUNTER — Encounter: Payer: Self-pay | Admitting: Family Medicine

## 2018-06-28 MED ORDER — METHYLPHENIDATE HCL ER (OSM) 27 MG PO TBCR: 27.0000 mg | EXTENDED_RELEASE_TABLET | Freq: Every day | ORAL | 0 refills | Status: DC

## 2018-06-28 NOTE — Telephone Encounter (Signed)
E perscribed 

## 2018-06-29 MED ORDER — OMEPRAZOLE 20 MG PO CPDR: 20.0000 mg | DELAYED_RELEASE_CAPSULE | Freq: Every day | ORAL | 3 refills | Status: DC

## 2018-07-26 ENCOUNTER — Other Ambulatory Visit: Payer: Self-pay | Admitting: Family Medicine

## 2018-07-26 NOTE — Telephone Encounter (Signed)
Name of Medication: Methylphenidate Name of Pharmacy: [facility] Last Fill or Written Date and Quantity: 06/28/18, E30 Last Office Visit and Type: 12/07/17, acute Next Office Visit and Type: 08/25/18, CPE Last Controlled Substance Agreement Date: 05/24/17 Last UDS: 05/24/17

## 2018-07-27 MED ORDER — METHYLPHENIDATE HCL ER (OSM) 27 MG PO TBCR: 27.0000 mg | EXTENDED_RELEASE_TABLET | Freq: Every day | ORAL | 0 refills | Status: DC

## 2018-07-27 NOTE — Telephone Encounter (Signed)
Eprescribed.

## 2018-08-24 ENCOUNTER — Other Ambulatory Visit: Payer: Self-pay | Admitting: Family Medicine

## 2018-08-25 ENCOUNTER — Encounter: Admitting: Family Medicine

## 2018-08-26 MED ORDER — METHYLPHENIDATE HCL ER (OSM) 27 MG PO TBCR: 27.0000 mg | EXTENDED_RELEASE_TABLET | Freq: Every day | ORAL | 0 refills | Status: DC

## 2018-08-26 NOTE — Telephone Encounter (Signed)
Eprescribed.

## 2018-08-26 NOTE — Telephone Encounter (Signed)
Pt requesting Rx refill for controlled medication . Please advise.

## 2018-09-10 ENCOUNTER — Other Ambulatory Visit: Payer: Self-pay | Admitting: Family Medicine

## 2018-09-13 ENCOUNTER — Other Ambulatory Visit: Payer: Self-pay | Admitting: Family Medicine

## 2018-09-26 ENCOUNTER — Other Ambulatory Visit: Payer: Self-pay | Admitting: Family Medicine

## 2018-09-26 MED ORDER — METHYLPHENIDATE HCL ER (OSM) 27 MG PO TBCR: 27.0000 mg | EXTENDED_RELEASE_TABLET | Freq: Every day | ORAL | 0 refills | Status: DC

## 2018-09-26 NOTE — Telephone Encounter (Signed)
Name of Medication: methylphenidate 27 mg CR Name of Pharmacy:[facility] [address]  Last Fill or Written Date and Quantity: # 30 on 08/26/18 Last Office Visit and Type: acute on 12/07/17 & annual 05/24/17 Next Office Visit and Type: 12/14/18 CPX Last Controlled Substance Agreement Date: 05/24/17 Last UDS:05/24/17

## 2018-09-26 NOTE — Telephone Encounter (Signed)
Eprescribed.

## 2018-10-24 ENCOUNTER — Other Ambulatory Visit: Payer: Self-pay | Admitting: Family Medicine

## 2018-10-24 NOTE — Telephone Encounter (Signed)
Name of Medication: Methylphenidate Name of Pharmacy: [facility] Last Fill or Written Date and Quantity: 09/26/18, #30 Last Office Visit and Type: 12/07/17, acute Next Office Visit and Type: 12/14/18, CPE Last Controlled Substance Agreement Date: 05/24/17 Last UDS: 05/24/17

## 2018-10-26 MED ORDER — METHYLPHENIDATE HCL ER (OSM) 27 MG PO TBCR: 27.0000 mg | EXTENDED_RELEASE_TABLET | Freq: Every day | ORAL | 0 refills | Status: DC

## 2018-10-26 NOTE — Telephone Encounter (Signed)
Eprescribed.

## 2018-11-12 DIAGNOSIS — S0532XA Ocular laceration without prolapse or loss of intraocular tissue, left eye, initial encounter: Secondary | ICD-10-CM

## 2018-11-12 HISTORY — DX: Ocular laceration without prolapse or loss of intraocular tissue, left eye, initial encounter: S05.32XA

## 2018-11-21 ENCOUNTER — Other Ambulatory Visit: Payer: Self-pay | Admitting: Family Medicine

## 2018-11-21 NOTE — Telephone Encounter (Signed)
Last office visit 12/07/2017 for laceration of abdomen.  Last refilled 10/26/2018 for #30 with no refills.  CPE scheduled for 12/14/2018.

## 2018-11-22 MED ORDER — METHYLPHENIDATE HCL ER (OSM) 27 MG PO TBCR: 27.0000 mg | EXTENDED_RELEASE_TABLET | Freq: Every day | ORAL | 0 refills | Status: DC

## 2018-11-22 NOTE — Telephone Encounter (Signed)
E perscribed 

## 2018-11-25 ENCOUNTER — Emergency Department

## 2018-11-25 ENCOUNTER — Emergency Department
Admission: EM | Admit: 2018-11-25 | Discharge: 2018-11-25 | Disposition: A | Discharge status: Other Acute Inpatient Hospital | Attending: Emergency Medicine | Admitting: Emergency Medicine

## 2018-11-25 ENCOUNTER — Encounter: Payer: Self-pay | Admitting: Emergency Medicine

## 2018-11-25 ENCOUNTER — Other Ambulatory Visit: Payer: Self-pay

## 2018-11-25 DIAGNOSIS — Y92013 Bedroom of single-family (private) house as the place of occurrence of the external cause: Secondary | ICD-10-CM | POA: Insufficient documentation

## 2018-11-25 DIAGNOSIS — Y999 Unspecified external cause status: Secondary | ICD-10-CM | POA: Diagnosis not present

## 2018-11-25 DIAGNOSIS — K2980 Duodenitis without bleeding: Secondary | ICD-10-CM | POA: Insufficient documentation

## 2018-11-25 DIAGNOSIS — Y9389 Activity, other specified: Secondary | ICD-10-CM | POA: Insufficient documentation

## 2018-11-25 DIAGNOSIS — Z20828 Contact with and (suspected) exposure to other viral communicable diseases: Secondary | ICD-10-CM | POA: Insufficient documentation

## 2018-11-25 DIAGNOSIS — F909 Attention-deficit hyperactivity disorder, unspecified type: Secondary | ICD-10-CM | POA: Insufficient documentation

## 2018-11-25 DIAGNOSIS — Z79899 Other long term (current) drug therapy: Secondary | ICD-10-CM | POA: Diagnosis not present

## 2018-11-25 DIAGNOSIS — S0522XA Ocular laceration and rupture with prolapse or loss of intraocular tissue, left eye, initial encounter: Secondary | ICD-10-CM | POA: Insufficient documentation

## 2018-11-25 DIAGNOSIS — K2100 Gastro-esophageal reflux disease with esophagitis, without bleeding: Secondary | ICD-10-CM | POA: Insufficient documentation

## 2018-11-25 DIAGNOSIS — E039 Hypothyroidism, unspecified: Secondary | ICD-10-CM | POA: Insufficient documentation

## 2018-11-25 DIAGNOSIS — R1310 Dysphagia, unspecified: Secondary | ICD-10-CM | POA: Insufficient documentation

## 2018-11-25 DIAGNOSIS — W5503XA Scratched by cat, initial encounter: Secondary | ICD-10-CM | POA: Diagnosis not present

## 2018-11-25 DIAGNOSIS — S0502XA Injury of conjunctiva and corneal abrasion without foreign body, left eye, initial encounter: Secondary | ICD-10-CM | POA: Diagnosis not present

## 2018-11-25 DIAGNOSIS — S0592XA Unspecified injury of left eye and orbit, initial encounter: Secondary | ICD-10-CM | POA: Diagnosis present

## 2018-11-25 DIAGNOSIS — K449 Diaphragmatic hernia without obstruction or gangrene: Secondary | ICD-10-CM | POA: Insufficient documentation

## 2018-11-25 DIAGNOSIS — J309 Allergic rhinitis, unspecified: Secondary | ICD-10-CM | POA: Insufficient documentation

## 2018-11-25 DIAGNOSIS — M545 Low back pain, unspecified: Secondary | ICD-10-CM | POA: Insufficient documentation

## 2018-11-25 LAB — SARS CORONAVIRUS 2 BY RT PCR (HOSPITAL ORDER, PERFORMED IN ~~LOC~~ HOSPITAL LAB): SARS Coronavirus 2: NEGATIVE

## 2018-11-25 MED ORDER — LEVOFLOXACIN IN D5W 750 MG/150ML IV SOLN: 750.0000 mg | INTRAVENOUS | Status: DC

## 2018-11-25 MED ORDER — MORPHINE SULFATE (PF) 4 MG/ML IV SOLN
4.0000 mg | Freq: Once | INTRAVENOUS | Status: AC
  Administered 2018-11-25: 4 mg via INTRAVENOUS
  Filled 2018-11-25: qty 1

## 2018-11-25 MED ORDER — MORPHINE SULFATE (PF) 4 MG/ML IV SOLN
INTRAVENOUS | Status: AC
  Filled 2018-11-25: qty 1

## 2018-11-25 MED ORDER — LEVOFLOXACIN IN D5W 750 MG/150ML IV SOLN
750.0000 mg | INTRAVENOUS | Status: DC
  Administered 2018-11-25: 750 mg via INTRAVENOUS
  Filled 2018-11-25: qty 150

## 2018-11-25 MED ORDER — TETRACAINE HCL 0.5 % OP SOLN
1.0000 [drp] | Freq: Once | OPHTHALMIC | Status: AC
  Administered 2018-11-25: 10:00:00 1 [drp] via OPHTHALMIC
  Filled 2018-11-25: qty 4

## 2018-11-25 MED ORDER — MORPHINE SULFATE (PF) 4 MG/ML IV SOLN
4.0000 mg | Freq: Once | INTRAVENOUS | Status: AC
  Administered 2018-11-25: 10:00:00 4 mg via INTRAVENOUS

## 2018-11-25 MED ORDER — FLUORESCEIN SODIUM 1 MG OP STRP
1.0000 | ORAL_STRIP | Freq: Once | OPHTHALMIC | Status: AC
  Administered 2018-11-25: 10:00:00 1 via OPHTHALMIC
  Filled 2018-11-25: qty 1

## 2018-11-25 MED ORDER — TETANUS-DIPHTH-ACELL PERTUSSIS 5-2.5-18.5 LF-MCG/0.5 IM SUSP
0.5000 mL | Freq: Once | INTRAMUSCULAR | Status: AC
  Administered 2018-11-25: 10:00:00 0.5 mL via INTRAMUSCULAR
  Filled 2018-11-25: qty 0.5

## 2018-11-25 NOTE — ED Provider Notes (Signed)
[facility] Emergency Department Provider Note  ____________________________________________  Time seen: Approximately 6:19 AM  I have reviewed the triage vital signs and the nursing notes.   HISTORY  Chief Complaint Eye Pain   HPI Phillip Gomez is a 53 y.o. male presents for evaluation of left eye injury.  Patient reports that he was laying on his bed when his cat was walking on the headboard.  The cat lost his balance and fell onto the patient.  Patient reports feeling 1 of the nails go inside his left eye.  He reports mild photophobia and throbbing sharp pain.  The accident happened just prior to arrival.  He reports mild blurry vision.  Tetanus shot is up-to-date.  No other injuries.   Past Medical History:  Diagnosis Date   ADHD (attention deficit hyperactivity disorder) 2005   Anxiety    Diverticulosis 2018   by CT   GERD (gastroesophageal reflux disease)    with HH   Hepatic steatosis 2018   by CT   History of chicken pox    Hypothyroidism 2012    Patient Active Problem List   Diagnosis Date Noted   Laceration of abdominal wall 12/07/2017   Cerumen impaction 12/07/2017   Obesity, Class I, BMI 30.0-34.9 (see actual BMI) 05/24/2017   Kidney lesion, native, left 09/30/2016   Health maintenance examination 04/22/2016   Dyslipidemia 04/13/2016   Plantar fasciitis 10/27/2015   Hypogonadism in male 02/19/2015   Chronic fatigue 02/07/2015   Hypothyroidism    ADD (attention deficit disorder)    Anxiety    GERD (gastroesophageal reflux disease)     Past Surgical History:  Procedure Laterality Date   COLONOSCOPY  02/2017   3mm TA sigmoid colon, [place] [facility] yrs ([facility]   FL BARIUM SWALLOW ([facility] HX)     HH, GERD   LASIK  1994   TONSILLECTOMY  ~1973   WISDOM TOOTH EXTRACTION  1990    Prior to Admission medications   Medication Sig Start Date End Date Taking? Authorizing Provider  FLUoxetine (PROZAC) 10 MG capsule TAKE 1  CAPSULE DAILY (TAKE WITH 20 MG CAPSULE) Patient taking differently: Take 10 mg by mouth daily.  09/14/18   Eustaquio BoydenGutierrez, Javier, MD  FLUoxetine (PROZAC) 20 MG capsule TAKE 1 CAPSULE DAILY (TAKE WITH 10 MG CAPSULE) Patient taking differently: Take 20 mg by mouth daily.  09/14/18   Eustaquio BoydenGutierrez, Javier, MD  levothyroxine (SYNTHROID) 137 MCG tablet TAKE 1 TABLET DAILY BEFORE BREAKFAST Patient taking differently: Take 137 mcg by mouth daily before breakfast.  09/12/18   Eustaquio BoydenGutierrez, Javier, MD  methylphenidate 27 MG PO CR tablet Take 1 tablet (27 mg total) by mouth daily. 11/22/18   Eustaquio BoydenGutierrez, Javier, MD  omeprazole (PRILOSEC) 20 MG capsule Take 1 capsule (20 mg total) by mouth daily. 06/29/18   Eustaquio BoydenGutierrez, Javier, MD    Allergies Other  Family History  Problem Relation Age of Onset   CAD Father 11063       MI   Diabetes Brother    Cancer Sister        leukemia (half-sister)   Stroke Neg Hx    Hypertension Neg Hx     Social History Social History   Tobacco Use   Smoking status: Never Smoker   Smokeless tobacco: Never Used  Substance Use Topics   Alcohol use: No    Alcohol/week: 0.0 standard drinks   Drug use: No    Review of Systems  Constitutional: Negative for fever. Eyes: +  eye pain ENT: Negative for sore throat. Neck: No neck pain  Cardiovascular: Negative for chest pain. Respiratory: Negative for shortness of breath. Gastrointestinal: Negative for abdominal pain, vomiting or diarrhea. Genitourinary: Negative for dysuria. Musculoskeletal: Negative for back pain. Skin: Negative for rash. Neurological: Negative for headaches, weakness or numbness. Psych: No SI or HI  ____________________________________________   PHYSICAL EXAM:  VITAL SIGNS: ED Triage Vitals [11/25/18 0539]  Enc Vitals Group     BP 119/83     Pulse Rate (!) 50     Resp 17     Temp 98 F (36.7 C)     Temp Source Oral     SpO2 99 %     Weight      Height      Head Circumference      Peak Flow       Pain Score      Pain Loc      Pain Edu?      Excl. in GC?     Constitutional: Alert and oriented. Well appearing and in no apparent distress. HEENT:      Head: Normocephalic and atraumatic.         EYE EXAM: EOMI. PERRL. Intact consensual light reflex. L sided photophobia. Large corneal abrasion seen with no staining applied. Bruising and questionable puncture wound to the medial aspect of the eye. Visual fields are intact. Abrasion to the lower eye lid      Mouth/Throat: Mucous membranes are moist.       Neck: Supple with no signs of meningismus. Cardiovascular: Regular rate and rhythm.  Respiratory: Normal respiratory effort.  Musculoskeletal: Nontender with normal range of motion in all extremities.  Neurologic: Normal speech and language. Face is symmetric. Moving all extremities. No gross focal neurologic deficits are appreciated. Skin: Skin is warm, dry and intact. No rash noted. Psychiatric: Mood and affect are normal. Speech and behavior are normal.  ____________________________________________   LABS (all labs ordered are listed, but only abnormal results are displayed)  Labs Reviewed - No data to display ____________________________________________  EKG  none  ____________________________________________  RADIOLOGY  none  ____________________________________________   PROCEDURES  Procedure(s) performed: None Procedures Critical Care performed:  None ____________________________________________   INITIAL IMPRESSION / ASSESSMENT AND PLAN / ED COURSE  53 y.o. male presents for evaluation of left eye injury after his cat fell on top of him. On exam patient has a large corneal abrasion seen with no staining applied. Concern for possible puncture wound to the medial aspect of the globe. At that time, exam was stopped, no staining, drops, or procedures requiring any eye globe pressure were done and ophthalmology was called. Discussed with Dr. Lara MulchHarrow who will  evaluate patient in the ED.     _________________________ 6:52 AM on 11/25/2018 -----------------------------------------  Patient awaiting for ophthalmology evaluation. Care transferred to Dr. Mayford KnifeWilliams.     As part of my medical decision making, I reviewed the following data within the electronic MEDICAL RECORD NUMBER Nursing notes reviewed and incorporated, Old chart reviewed, A consult was requested and obtained from this/these consultant(s) ophthalmology, Notes from prior ED visits and [place] Controlled Substance Database   Patient was evaluated in Emergency Department today for the symptoms described in the history of present illness. Patient was evaluated in the context of the global COVID-19 pandemic, which necessitated consideration that the patient might be at risk for infection with the SARS-CoV-2 virus that causes COVID-19. Institutional protocols and algorithms that pertain to the evaluation of  patients at risk for COVID-19 are in a state of rapid change based on information released by regulatory bodies including the CDC and federal and state organizations. These policies and algorithms were followed during the patient's care in the ED.   ____________________________________________   FINAL CLINICAL IMPRESSION(S) / ED DIAGNOSES   Final diagnoses:  Traumatic injury of globe of left eye      NEW MEDICATIONS STARTED DURING THIS VISIT:  ED Discharge Orders    None       Note:  This document was prepared using Dragon voice recognition software and may include unintentional dictation errors.    Alfred Levins, [place], MD 11/25/18 [phone]

## 2018-11-25 NOTE — ED Triage Notes (Signed)
Pt reports cat fell from headboard this AM, landing on pts face, scratching the left eye and under the eye as well. No bleeding but obvious trauma to the left eye with abrasion under.

## 2018-11-25 NOTE — ED Notes (Signed)
Patient transported to CT 

## 2018-11-25 NOTE — ED Notes (Signed)
First attempt to report at [facility]. Unable to reach anyone at [facility].

## 2018-11-25 NOTE — ED Notes (Signed)
Pt requesting pain medication before leaving

## 2018-11-25 NOTE — Consult Note (Signed)
  Ophthalmology   HPI: This is a 53 year old man whose cat fell on his face this morning (while he was waking up), and the cat scratched his cornea. Patient came to the ED for treatment.   Exam: Conducted at bedside     Best corrected vision: J1+ OD / J2 OS with difficulty Intraocular pressure:  soft OD;   OS (deferred - open globe) Pupils: PERRL, no abnormality, no APD Motility: Full Visual Field: Full    Lids/Lashes: inferior laceration OS Conjunctiva: subconj hemorrhage Cornea: OD normal; 11 mm laceration OS in V shape superior cornea. Believe to be shelf perforation that has self-sealed, but needs surgical repair Anterior chamber: deep and quiet, both eyes; iris within normal limits, not mishapen Lenses: wnl Vitreous: deferred - refer to [facility] for emergent repair and surgical exploration Optic discs: deferred - refer to [facility] for emergent repair and surgical exploration Posterior segment: deferred - refer to [facility] for emergent repair and surgical exploration     ASSESSMENT & PLAN  Open Globe Corneal Laceration believed to be full thickness - From cat scratch of left eye. Vision is good, but laceration requires repair.  Recs: - IV moxifloxacin 400 mg x 1 dose - Maxitrol ointment over left eye (apply gently) - Eye patch over left eye - Transfer to [facility] ED for emergent repair of left cornea. [facility] has a strong cornea department and will provide the patient with the best care in the region for this particular injury.  If further questions, please page ophthalmology.  Marchia Meiers, MD

## 2018-11-25 NOTE — ED Notes (Signed)
Alert and oriented. NAD. Leaving with [facility] to [place].

## 2018-11-25 NOTE — ED Notes (Signed)
Have been on phone entire time since 0924 still unable to reach anyone.  Phone to ED charge that this Rn was given just rings, no one answers.  Have not reached anyone at [facility] to answer.

## 2018-11-25 NOTE — ED Notes (Signed)
Pt in eye room with ophthalmology consult.

## 2018-11-25 NOTE — ED Provider Notes (Signed)
Patient with a full-thickness laceration and open globe as per ophthalmology.  Request was made to transfer the patient to [facility] by Dr. Neville Route.    Earleen Newport, MD 11/25/18 [phone]

## 2018-11-25 NOTE — ED Notes (Signed)
Pt alert and oriented. NAD. No needs at this time.

## 2018-11-25 NOTE — ED Notes (Signed)
Have still not reached anyone at [facility] for report. Report to [facility].  Pt leaving.  EDP williams has given report to [facility] EDP.  Per charge greg RN ok for transfer.

## 2018-11-25 NOTE — ED Notes (Signed)
EMTALA reviewed. 

## 2018-12-07 ENCOUNTER — Other Ambulatory Visit

## 2018-12-12 ENCOUNTER — Other Ambulatory Visit: Payer: Self-pay | Admitting: Family Medicine

## 2018-12-14 ENCOUNTER — Encounter: Admitting: Family Medicine

## 2018-12-25 ENCOUNTER — Other Ambulatory Visit: Payer: Self-pay | Admitting: Family Medicine

## 2018-12-26 NOTE — Telephone Encounter (Signed)
Name of Medication: Methylphenidate Name of Pharmacy: [facility] Last Fill or Written Date and Quantity: 11/22/18, #30 Last Office Visit and Type: 12/07/17, acute Next Office Visit and Type: 02/07/19, CPE Last Controlled Substance Agreement Date: 05/24/17 Last UDS: 05/24/17

## 2018-12-27 MED ORDER — METHYLPHENIDATE HCL ER (OSM) 27 MG PO TBCR: 27.0000 mg | EXTENDED_RELEASE_TABLET | Freq: Every day | ORAL | 0 refills | Status: DC

## 2018-12-27 NOTE — Telephone Encounter (Signed)
Eprescribed.

## 2019-01-22 ENCOUNTER — Other Ambulatory Visit: Payer: Self-pay | Admitting: Family Medicine

## 2019-01-23 MED ORDER — METHYLPHENIDATE HCL ER (OSM) 27 MG PO TBCR: 27.0000 mg | EXTENDED_RELEASE_TABLET | Freq: Every day | ORAL | 0 refills | Status: DC

## 2019-01-23 NOTE — Telephone Encounter (Signed)
ERx 

## 2019-01-23 NOTE — Telephone Encounter (Signed)
Last office visit 12/07/2017 for hospital follow up.  Last refilled 12/27/2018 for #30 with no refills.  UDS/Contract 05/24/2017.  CPE scheduled for 02/07/2019.

## 2019-02-02 ENCOUNTER — Other Ambulatory Visit: Payer: Self-pay | Admitting: Family Medicine

## 2019-02-02 DIAGNOSIS — E785 Hyperlipidemia, unspecified: Secondary | ICD-10-CM

## 2019-02-02 DIAGNOSIS — E039 Hypothyroidism, unspecified: Secondary | ICD-10-CM

## 2019-02-03 ENCOUNTER — Other Ambulatory Visit: Payer: Self-pay

## 2019-02-03 ENCOUNTER — Other Ambulatory Visit (INDEPENDENT_AMBULATORY_CARE_PROVIDER_SITE_OTHER)

## 2019-02-03 DIAGNOSIS — E785 Hyperlipidemia, unspecified: Secondary | ICD-10-CM | POA: Diagnosis not present

## 2019-02-03 DIAGNOSIS — E039 Hypothyroidism, unspecified: Secondary | ICD-10-CM

## 2019-02-03 NOTE — Addendum Note (Signed)
Addended by: Ellamae Sia on: 02/03/2019 07:39 AM   Modules accepted: Orders

## 2019-02-04 LAB — TSH: TSH: 0.784 u[IU]/mL (ref 0.450–4.500)

## 2019-02-04 LAB — COMPREHENSIVE METABOLIC PANEL
ALT: 27 IU/L (ref 0–44)
AST: 27 IU/L (ref 0–40)
Albumin/Globulin Ratio: 1.6 (ref 1.2–2.2)
Albumin: 4.4 g/dL (ref 3.8–4.9)
Alkaline Phosphatase: 90 IU/L (ref 39–117)
BUN/Creatinine Ratio: 21 — ABNORMAL HIGH (ref 9–20)
BUN: 22 mg/dL (ref 6–24)
Bilirubin Total: 0.4 mg/dL (ref 0.0–1.2)
CO2: 26 mmol/L (ref 20–29)
Calcium: 9.4 mg/dL (ref 8.7–10.2)
Chloride: 102 mmol/L (ref 96–106)
Creatinine, Ser: 1.06 mg/dL (ref 0.76–1.27)
GFR calc Af Amer: 93 mL/min/{1.73_m2} (ref >59)
GFR calc non Af Amer: 80 mL/min/{1.73_m2} (ref >59)
Globulin, Total: 2.7 g/dL (ref 1.5–4.5)
Glucose: 100 mg/dL — ABNORMAL HIGH (ref 65–99)
Potassium: 4.7 mmol/L (ref 3.5–5.2)
Sodium: 138 mmol/L (ref 134–144)
Total Protein: 7.1 g/dL (ref 6.0–8.5)

## 2019-02-04 LAB — LIPID PANEL
Chol/HDL Ratio: 3.8 ratio (ref 0.0–5.0)
Cholesterol, Total: 147 mg/dL (ref 100–199)
HDL: 39 mg/dL — ABNORMAL LOW (ref >39)
LDL Chol Calc (NIH): 92 mg/dL (ref 0–99)
Triglycerides: 85 mg/dL (ref 0–149)
VLDL Cholesterol Cal: 16 mg/dL (ref 5–40)

## 2019-02-04 LAB — T4, FREE: Free T4: 1.89 ng/dL — ABNORMAL HIGH (ref 0.82–1.77)

## 2019-02-07 ENCOUNTER — Encounter: Payer: Self-pay | Admitting: Family Medicine

## 2019-02-07 ENCOUNTER — Other Ambulatory Visit: Payer: Self-pay

## 2019-02-07 ENCOUNTER — Ambulatory Visit (INDEPENDENT_AMBULATORY_CARE_PROVIDER_SITE_OTHER): Admitting: Family Medicine

## 2019-02-07 VITALS — BP 118/68 | HR 67 | Temp 98.6°F | Ht 72.0 in | Wt 231.4 lb

## 2019-02-07 DIAGNOSIS — E785 Hyperlipidemia, unspecified: Secondary | ICD-10-CM

## 2019-02-07 DIAGNOSIS — N289 Disorder of kidney and ureter, unspecified: Secondary | ICD-10-CM

## 2019-02-07 DIAGNOSIS — F419 Anxiety disorder, unspecified: Secondary | ICD-10-CM | POA: Diagnosis not present

## 2019-02-07 DIAGNOSIS — K219 Gastro-esophageal reflux disease without esophagitis: Secondary | ICD-10-CM

## 2019-02-07 DIAGNOSIS — F988 Other specified behavioral and emotional disorders with onset usually occurring in childhood and adolescence: Secondary | ICD-10-CM | POA: Diagnosis not present

## 2019-02-07 DIAGNOSIS — E669 Obesity, unspecified: Secondary | ICD-10-CM

## 2019-02-07 DIAGNOSIS — K760 Fatty (change of) liver, not elsewhere classified: Secondary | ICD-10-CM | POA: Insufficient documentation

## 2019-02-07 DIAGNOSIS — E039 Hypothyroidism, unspecified: Secondary | ICD-10-CM

## 2019-02-07 DIAGNOSIS — Z0000 Encounter for general adult medical examination without abnormal findings: Secondary | ICD-10-CM

## 2019-02-07 NOTE — Assessment & Plan Note (Signed)
Chronic, stable off meds. Improvement with healthier diet this year. Continue healthy diet.  The 10-year ASCVD risk score Mikey Bussing DC Brooke Bonito., et al., 2013) is: 3.1%   Values used to calculate the score:     Age: 53 years     Sex: Male     Is Non-Hispanic African American: No     Diabetic: No     Tobacco smoker: No     Systolic Blood Pressure: 622 mmHg     Is BP treated: No     HDL Cholesterol: 39 mg/dL     Total Cholesterol: 147 mg/dL

## 2019-02-07 NOTE — Patient Instructions (Addendum)
[facility]ing[facility]). You are doing well today - continue current medicines, watch for high thyroid symptoms.  Urine screen today.  Return as needed or in 1 year for next physical.  Health Maintenance, Male Adopting a healthy lifestyle and getting preventive care are important in promoting health and wellness. Ask your health care provider about:  The right schedule for you to have regular tests and exams.  Things you can do on your own to prevent diseases and keep yourself healthy. What should I know about diet, weight, and exercise? Eat a healthy diet   Eat a diet that includes plenty of vegetables, fruits, low-fat dairy products, and lean protein.  Do not eat a lot of foods that are high in solid fats, added sugars, or sodium. Maintain a healthy weight Body mass index (BMI) is a measurement that can be used to identify possible weight problems. It estimates body fat based on height and weight. Your health care provider can help determine your BMI and help you achieve or maintain a healthy weight. Get regular exercise Get regular exercise. This is one of the most important things you can do for your health. Most adults should:  Exercise for at least 150 minutes each week. The exercise should increase your heart rate and make you sweat (moderate-intensity exercise).  Do strengthening exercises at least twice a week. This is in addition to the moderate-intensity exercise.  Spend less time sitting. Even light physical activity can be beneficial. Watch cholesterol and blood lipids Have your blood tested for lipids and cholesterol at 51 years of age, then have this test every 5 years. You may need to have your cholesterol levels checked more often if:  Your lipid or cholesterol levels are high.  You are older than 52 years of age.  You are at high risk for heart disease. What should I know about cancer screening? Many types of cancers can be  detected early and may often be prevented. Depending on your health history and family history, you may need to have cancer screening at various ages. This may include screening for:  Colorectal cancer.  Prostate cancer.  Skin cancer.  Lung cancer. What should I know about heart disease, diabetes, and high blood pressure? Blood pressure and heart disease  High blood pressure causes heart disease and increases the risk of stroke. This is more likely to develop in people who have high blood pressure readings, are of African descent, or are overweight.  Talk with your health care provider about your target blood pressure readings.  Have your blood pressure checked: ? Every 3-5 years if you are 2-72 years of age. ? Every year if you are 70 years old or older.  If you are between the ages of 53 and 56 and are a current or former smoker, ask your health care provider if you should have a one-time screening for abdominal aortic aneurysm (AAA). Diabetes Have regular diabetes screenings. This checks your fasting blood sugar level. Have the screening done:  Once every three years after age 41 if you are at a normal weight and have a low risk for diabetes.  More often and at a younger age if you are overweight or have a high risk for diabetes. What should I know about preventing infection? Hepatitis B If you have a higher risk for hepatitis B, you should be screened for this virus. Talk with your health care provider to find out if you are at  risk for hepatitis B infection. Hepatitis C Blood testing is recommended for:  Everyone born from [birth year] through 1964.  Anyone with known risk factors for hepatitis C. Sexually transmitted infections (STIs)  You should be screened each year for STIs, including gonorrhea and chlamydia, if: ? You are sexually active and are younger than 52 years of age. ? You are older than 52 years of age and your health care provider tells you that you are at risk  for this type of infection. ? Your sexual activity has changed since you were last screened, and you are at increased risk for chlamydia or gonorrhea. Ask your health care provider if you are at risk.  Ask your health care provider about whether you are at high risk for HIV. Your health care provider may recommend a prescription medicine to help prevent HIV infection. If you choose to take medicine to prevent HIV, you should first get tested for HIV. You should then be tested every 3 months for as long as you are taking the medicine. Follow these instructions at home: Lifestyle  Do not use any products that contain nicotine or tobacco, such as cigarettes, e-cigarettes, and chewing tobacco. If you need help quitting, ask your health care provider.  Do not use street drugs.  Do not share needles.  Ask your health care provider for help if you need support or information about quitting drugs. Alcohol use  Do not drink alcohol if your health care provider tells you not to drink.  If you drink alcohol: ? Limit how much you have to 0-2 drinks a day. ? Be aware of how much alcohol is in your drink. In the [place] one drink equals one 12 oz bottle of beer (355 mL), one 5 oz glass of wine (148 mL), or one 1 oz glass of hard liquor (44 mL). General instructions  Schedule regular health, dental, and eye exams.  Stay current with your vaccines.  Tell your health care provider if: ? You often feel depressed. ? You have ever been abused or do not feel safe at home. Summary  Adopting a healthy lifestyle and getting preventive care are important in promoting health and wellness.  Follow your health care provider's instructions about healthy diet, exercising, and getting tested or screened for diseases.  Follow your health care provider's instructions on monitoring your cholesterol and blood pressure. This information is not intended to replace advice given to you by your health care provider.  Make sure you discuss any questions you have with your health care provider. Document Released: 09/26/2007 Document Revised: 03/23/2018 Document Reviewed: 03/23/2018 Elsevier Patient Education  2020 [employer].

## 2019-02-07 NOTE — Assessment & Plan Note (Signed)
Chronic, stable on omeprazole 20mg  daily - continue.

## 2019-02-07 NOTE — Assessment & Plan Note (Signed)
Noted on CT last year.

## 2019-02-07 NOTE — Assessment & Plan Note (Signed)
Chronic, TSH normal but free T4 elevated. Denies hyperthyroid symptoms - will continue current dose.

## 2019-02-07 NOTE — Progress Notes (Signed)
This visit was conducted in person.  BP 118/68 (BP Location: Left Arm, Patient Position: Sitting, Cuff Size: Large)   Pulse 67   Temp 98.6 F (37 C) (Temporal)   Ht 6' (1.829 m)   Wt 231 lb 7 oz (105 kg)   SpO2 96%   BMI 31.39 kg/m    CC: CPE Subjective:    Patient ID: Phillip Gomez, male    DOB: 10/19/1965, 53 y.o.   MRN: [id number]  HPI: Phillip Gomez is a 53 y.o. male presenting on 02/07/2019 for Annual Exam   ADD - tolerating methylphenidate well. Denies headache, insomnia, loss of appetite, or chest pain. Does not do stimulant holidays. Finds it really helps energy level. Drinks 1 cup coffee/day - notes less recently.   Preventative: Colon cancer screening -colonsocopy 02/2017 - 3 polyps, rpt 5 yrs (through [facility])  Prostate cancer screening -discussed, would like to continue screens. DRE at [facility].  Flu shotyearly Tdap 2014, again 11/2018 Shingrix - discussed, will [facility] Seat belt use discussed Sunscreen use discussed. No changing moles on skin. 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 Retired from [employer] Activity: stays active at work  Diet: good water, fruits/vegetables some     Relevant past medical, surgical, family and social history reviewed and updated as indicated. Interim medical history since our last visit reviewed. Allergies and medications reviewed and updated. Outpatient Medications Prior to Visit  Medication Sig Dispense Refill  . FLUoxetine (PROZAC) 10 MG capsule Take 1 capsule (10 mg total) by mouth daily. Keep appointment in October please 90 capsule 0  . FLUoxetine (PROZAC) 20 MG capsule Take 1 capsule (20 mg total) by mouth daily. Keep appointment in October please 90 capsule 0  . levothyroxine (SYNTHROID) 137 MCG tablet TAKE 1 TABLET DAILY BEFORE BREAKFAST (Patient taking differently: Take 137  mcg by mouth daily before breakfast. ) 90 tablet 1  . methylphenidate 27 MG PO CR tablet Take 1 tablet (27 mg total) by mouth daily. 30 tablet 0  . omeprazole (PRILOSEC) 20 MG capsule Take 1 capsule (20 mg total) by mouth daily. 90 capsule 3   No facility-administered medications prior to visit.      Per HPI unless specifically indicated in ROS section below Review of Systems  Constitutional: Negative for activity change, appetite change, chills, fatigue, fever and unexpected weight change.  HENT: Negative for hearing loss.   Eyes: Negative for visual disturbance.  Respiratory: Positive for cough (in am - due to sinuses draining). Negative for chest tightness, shortness of breath and wheezing.   Cardiovascular: Negative for chest pain, palpitations and leg swelling.  Gastrointestinal: Negative for abdominal distention, abdominal pain, blood in stool, constipation, diarrhea, nausea and vomiting.  Genitourinary: Negative for difficulty urinating and hematuria.  Musculoskeletal: Negative for arthralgias, myalgias and neck pain.  Skin: Negative for rash.  Neurological: Negative for dizziness, seizures, syncope and headaches.  Hematological: Negative for adenopathy. Does not bruise/bleed easily.  Psychiatric/Behavioral: Negative for dysphoric mood. The patient is not nervous/anxious.    Objective:    BP 118/68 (BP Location: Left Arm, Patient Position: Sitting, Cuff Size: Large)   Pulse 67   Temp 98.6 F (37 C) (Temporal)   Ht 6' (1.829 m)   Wt 231 lb 7 oz (105 kg)   SpO2 96%   BMI 31.39 kg/m   Wt  Readings from Last 3 Encounters:  02/07/19 231 lb 7 oz (105 kg)  12/07/17 235 lb (106.6 kg)  11/26/17 225 lb (102.1 kg)    Physical Exam Vitals signs and nursing note reviewed.  Constitutional:      General: He is not in acute distress.    Appearance: Normal appearance. He is well-developed. He is not ill-appearing.  HENT:     Head: Normocephalic and atraumatic.     Right Ear:  Hearing, tympanic membrane, ear canal and external ear normal.     Left Ear: Hearing, tympanic membrane, ear canal and external ear normal.     Nose: Nose normal.     Mouth/Throat:     Mouth: Mucous membranes are moist.     Pharynx: Oropharynx is clear. Uvula midline. No posterior oropharyngeal erythema.  Eyes:     General: No scleral icterus.    Conjunctiva/sclera: Conjunctivae normal.     Pupils: Pupils are equal, round, and reactive to light.  Neck:     Musculoskeletal: Normal range of motion and neck supple.  Cardiovascular:     Rate and Rhythm: Normal rate and regular rhythm.     Pulses: Normal pulses.          Radial pulses are 2+ on the right side and 2+ on the left side.     Heart sounds: Normal heart sounds. No murmur.  Pulmonary:     Effort: Pulmonary effort is normal. No respiratory distress.     Breath sounds: Normal breath sounds. No wheezing, rhonchi or rales.  Abdominal:     General: Abdomen is flat. Bowel sounds are normal. There is no distension.     Palpations: Abdomen is soft. There is no mass.     Tenderness: There is no abdominal tenderness. There is no guarding or rebound.     Hernia: No hernia is present.  Musculoskeletal: Normal range of motion.     Right lower leg: No edema.     Left lower leg: No edema.  Lymphadenopathy:     Cervical: No cervical adenopathy.  Skin:    General: Skin is warm and dry.     Findings: No rash.  Neurological:     General: No focal deficit present.     Mental Status: He is alert and oriented to person, place, and time.     Comments: CN grossly intact, station and gait intact  Psychiatric:        Mood and Affect: Mood normal.        Behavior: Behavior normal.        Thought Content: Thought content normal.        Judgment: Judgment normal.       Results for orders placed or performed in visit on 02/03/19  Lipid panel  Result Value Ref Range   Cholesterol, Total 147 100 - 199 mg/dL   Triglycerides 85 0 - 149 mg/dL    HDL 39 (L) >07 mg/dL   VLDL Cholesterol Cal 16 5 - 40 mg/dL   LDL Chol Calc (NIH) 92 0 - 99 mg/dL   Chol/HDL Ratio 3.8 0.0 - 5.0 ratio  Comprehensive metabolic panel  Result Value Ref Range   Glucose 100 (H) 65 - 99 mg/dL   BUN 22 6 - 24 mg/dL   Creatinine, Ser 3.71 0.76 - 1.27 mg/dL   GFR calc non Af Amer 80 >59 mL/min/1.73   GFR calc Af Amer 93 >59 mL/min/1.73   BUN/Creatinine Ratio 21 (H) 9 - 20  Sodium 138 134 - 144 mmol/L   Potassium 4.7 3.5 - 5.2 mmol/L   Chloride 102 96 - 106 mmol/L   CO2 26 20 - 29 mmol/L   Calcium 9.4 8.7 - 10.2 mg/dL   Total Protein 7.1 6.0 - 8.5 g/dL   Albumin 4.4 3.8 - 4.9 g/dL   Globulin, Total 2.7 1.5 - 4.5 g/dL   Albumin/Globulin Ratio 1.6 1.2 - 2.2   Bilirubin Total 0.4 0.0 - 1.2 mg/dL   Alkaline Phosphatase 90 39 - 117 IU/L   AST 27 0 - 40 IU/L   ALT 27 0 - 44 IU/L  TSH  Result Value Ref Range   TSH 0.784 0.450 - 4.500 uIU/mL  T4, free  Result Value Ref Range   Free T4 1.89 (H) 0.82 - 1.77 ng/dL   Assessment & Plan:   Problem List Items Addressed This Visit    Obesity, Class I, BMI 30.0-34.9 (see actual BMI)    Pt motivated to continue healthy diet changes. He is following low carb diet with his wife.       Kidney lesion, native, left    Will need repeat renal CT 03/2019.       Hypothyroidism    Chronic, TSH normal but free T4 elevated. Denies hyperthyroid symptoms - will continue current dose.       Health maintenance examination - Primary    Preventative protocols reviewed and updated unless pt declined. Discussed healthy diet and lifestyle.       GERD (gastroesophageal reflux disease)    Chronic, stable on omeprazole 20mg  daily - continue.       Fatty liver    Noted on CT last year.       Dyslipidemia    Chronic, stable off meds. Improvement with healthier diet this year. Continue healthy diet.  The 10-year ASCVD risk score Mikey Bussing DC Brooke Bonito., et al., 2013) is: 3.1%   Values used to calculate the score:     Age: 55 years      Sex: Male     Is Non-Hispanic African American: No     Diabetic: No     Tobacco smoker: No     Systolic Blood Pressure: 010 mmHg     Is BP treated: No     HDL Cholesterol: 39 mg/dL     Total Cholesterol: 147 mg/dL       Anxiety    Chronic, stable. Continue prozac 30mg  daily.       ADD (attention deficit disorder)    Chronic, stable. Tolerating well. Continue current regimen. Update UDS. He fels med helps with energy and concentration.       Relevant Orders   Pain Mgmt, Profile 8 w/Conf, U       No orders of the defined types were placed in this encounter.  Orders Placed This Encounter  Procedures  . Pain Mgmt, Profile 8 w/Conf, U    Order Specific Question:   Prescribed drugs 1:    Answer:   METHYLPHENIDATE    Patient instructions: [facility]). You are doing well today - continue current medicines, watch for high thyroid symptoms.  Urine screen today.  Return as needed or in 1 year for next physical.  Follow up plan: Return in about 1 year (around 02/07/2020) for annual exam, prior fasting for blood work.  Ria Bush, MD

## 2019-02-07 NOTE — Assessment & Plan Note (Addendum)
Chronic, stable. Tolerating well. Continue current regimen. Update UDS. He fels med helps with energy and concentration.

## 2019-02-07 NOTE — Assessment & Plan Note (Signed)
Chronic, stable. Continue prozac 30mg  daily.

## 2019-02-07 NOTE — Assessment & Plan Note (Signed)
Will need repeat renal CT 03/2019.

## 2019-02-07 NOTE — Assessment & Plan Note (Signed)
Preventative protocols reviewed and updated unless pt declined. Discussed healthy diet and lifestyle.  

## 2019-02-07 NOTE — Assessment & Plan Note (Signed)
Pt motivated to continue healthy diet changes. He is following low carb diet with his wife.

## 2019-02-08 LAB — PAIN MGMT, PROFILE 8 W/CONF, U
6 Acetylmorphine: NEGATIVE ng/mL
Alcohol Metabolites: NEGATIVE ng/mL (ref <500)
Amphetamines: NEGATIVE ng/mL
Benzodiazepines: NEGATIVE ng/mL
Buprenorphine, Urine: NEGATIVE ng/mL
Cocaine Metabolite: NEGATIVE ng/mL
Creatinine: 218.5 mg/dL
MDMA: NEGATIVE ng/mL
Marijuana Metabolite: NEGATIVE ng/mL
Opiates: NEGATIVE ng/mL
Oxidant: NEGATIVE ug/mL
Oxycodone: NEGATIVE ng/mL
pH: 5.5 (ref 4.5–9.0)

## 2019-02-21 ENCOUNTER — Other Ambulatory Visit: Payer: Self-pay | Admitting: Family Medicine

## 2019-02-21 MED ORDER — METHYLPHENIDATE HCL ER (OSM) 27 MG PO TBCR: 27.0000 mg | EXTENDED_RELEASE_TABLET | Freq: Every day | ORAL | 0 refills | Status: DC

## 2019-02-21 NOTE — Telephone Encounter (Signed)
Name of Medication: Methylphenidate Name of Pharmacy: [facility] or Written Date and Quantity: 01/23/19, #30 Last Office Visit and Type: 02/07/19, CPE Next Office Visit and Type: 02/12/20, CPE Last Controlled Substance Agreement Date: 05/24/17 Last UDS: 02/07/19

## 2019-02-21 NOTE — Telephone Encounter (Signed)
ERx 

## 2019-03-08 ENCOUNTER — Other Ambulatory Visit: Payer: Self-pay | Admitting: Family Medicine

## 2019-03-11 ENCOUNTER — Other Ambulatory Visit: Payer: Self-pay | Admitting: Family Medicine

## 2019-03-22 ENCOUNTER — Other Ambulatory Visit: Payer: Self-pay | Admitting: Family Medicine

## 2019-03-24 ENCOUNTER — Other Ambulatory Visit: Payer: Self-pay | Admitting: Family Medicine

## 2019-03-24 NOTE — Telephone Encounter (Signed)
Name of Medication: Methylphenidate Name of Pharmacy: [facility] or Written Date and Quantity: 02/21/19, #30 Last Office Visit and Type: 02/07/19, CPE Next Office Visit and Type: 02/12/20, CPE Last Controlled Substance Agreement Date: 05/24/17 Last UDS: 02/07/19

## 2019-03-27 MED ORDER — METHYLPHENIDATE HCL ER (OSM) 27 MG PO TBCR: 27.0000 mg | EXTENDED_RELEASE_TABLET | Freq: Every day | ORAL | 0 refills | Status: DC

## 2019-03-27 NOTE — Telephone Encounter (Signed)
ERx 

## 2019-04-06 ENCOUNTER — Ambulatory Visit: Attending: Internal Medicine

## 2019-04-06 DIAGNOSIS — Z20822 Contact with and (suspected) exposure to covid-19: Secondary | ICD-10-CM

## 2019-04-07 LAB — NOVEL CORONAVIRUS, NAA: SARS-CoV-2, NAA: NOT DETECTED

## 2019-04-09 ENCOUNTER — Telehealth: Payer: Self-pay | Admitting: Family Medicine

## 2019-04-09 DIAGNOSIS — N281 Cyst of kidney, acquired: Secondary | ICD-10-CM

## 2019-04-09 NOTE — Telephone Encounter (Signed)
-----   Message from Ria Bush, MD sent at 02/07/2019 10:25 PM EDT ----- Repeat renal protocol CT 03/2019.

## 2019-04-22 ENCOUNTER — Other Ambulatory Visit: Payer: Self-pay | Admitting: Family Medicine

## 2019-04-24 NOTE — Telephone Encounter (Signed)
Name of Medication: Methylphenidate Name of Pharmacy: [facility] Last Fill or Written Date and Quantity: 03/27/19, #30 Last Office Visit and Type: 02/07/19, CPE Next Office Visit and Type: 02/11/26, CPE Last Controlled Substance Agreement Date: 05/24/17 Last UDS:  02/07/19

## 2019-04-25 MED ORDER — METHYLPHENIDATE HCL ER (OSM) 27 MG PO TBCR: 27.0000 mg | EXTENDED_RELEASE_TABLET | Freq: Every day | ORAL | 0 refills | Status: DC

## 2019-04-25 NOTE — Telephone Encounter (Signed)
ERx 

## 2019-04-26 ENCOUNTER — Ambulatory Visit
Admission: RE | Admit: 2019-04-26 | Discharge: 2019-04-26 | Disposition: A | Discharge status: Home / Self Care | Source: Ambulatory Visit | Attending: Family Medicine | Admitting: Family Medicine

## 2019-04-26 ENCOUNTER — Other Ambulatory Visit: Payer: Self-pay

## 2019-04-26 DIAGNOSIS — N281 Cyst of kidney, acquired: Secondary | ICD-10-CM | POA: Insufficient documentation

## 2019-04-26 MED ORDER — IOHEXOL 300 MG/ML  SOLN
100.0000 mL | Freq: Once | INTRAMUSCULAR | Status: AC | PRN
  Administered 2019-04-26: 08:00:00 100 mL via INTRAVENOUS

## 2019-05-24 ENCOUNTER — Other Ambulatory Visit: Payer: Self-pay | Admitting: Family Medicine

## 2019-05-24 MED ORDER — METHYLPHENIDATE HCL ER (OSM) 27 MG PO TBCR: 27.0000 mg | EXTENDED_RELEASE_TABLET | Freq: Every day | ORAL | 0 refills | Status: DC

## 2019-05-24 NOTE — Telephone Encounter (Signed)
ERx 

## 2019-05-24 NOTE — Telephone Encounter (Signed)
Name of Medication: Methylphenidate Name of Pharmacy: [facility] Last Fill or Written Date and Quantity: 04/25/19, #30 Last Office Visit and Type: 02/07/19, CPE Next Office Visit and Type: 02/12/20, CPE Last Controlled Substance Agreement Date: 05/24/17 Last UDS: 02/07/19

## 2019-06-22 ENCOUNTER — Other Ambulatory Visit: Payer: Self-pay | Admitting: Family Medicine

## 2019-06-22 NOTE — Telephone Encounter (Signed)
Name of Medication: Methylphenidate Name of Pharmacy: [facility] Last Fill or Written Date and Quantity: 05/24/19, #30 Last Office Visit and Type: 02/07/19, CPE Next Office Visit and Type: 02/12/20, CPE Last Controlled Substance Agreement Date: 05/24/17 Last UDS: 02/07/19

## 2019-06-24 ENCOUNTER — Other Ambulatory Visit: Payer: Self-pay

## 2019-06-24 ENCOUNTER — Ambulatory Visit: Attending: Internal Medicine

## 2019-06-24 DIAGNOSIS — Z23 Encounter for immunization: Secondary | ICD-10-CM

## 2019-06-24 NOTE — Progress Notes (Signed)
   Covid-19 Vaccination Clinic  Name:  Boaz Berisha    MRN: [id number] DOB: 1966-01-16  06/24/2019  Mr. Mavis was observed post Covid-19 immunization for 15 minutes without incident. He was provided with Vaccine Information Sheet and instruction to access the V-Safe system.   Mr. Douthat was instructed to call 911 with any severe reactions post vaccine: Marland Kitchen Difficulty breathing  . Swelling of face and throat  . A fast heartbeat  . A bad rash all over body  . Dizziness and weakness   Immunizations Administered    Name Date Dose VIS Date Route   Pfizer COVID-19 Vaccine 06/24/2019 11:57 AM 0.3 mL 03/24/2019 Intramuscular   Manufacturer: [employer], [employer]   Lot: [id number]   NDC: [address]-3

## 2019-06-26 MED ORDER — METHYLPHENIDATE HCL ER (OSM) 27 MG PO TBCR: 27.0000 mg | EXTENDED_RELEASE_TABLET | Freq: Every day | ORAL | 0 refills | Status: DC

## 2019-06-26 NOTE — Telephone Encounter (Signed)
ERx 

## 2019-06-27 ENCOUNTER — Ambulatory Visit

## 2019-07-16 ENCOUNTER — Other Ambulatory Visit: Payer: Self-pay | Admitting: Family Medicine

## 2019-07-18 ENCOUNTER — Telehealth: Payer: Self-pay | Admitting: *Deleted

## 2019-07-18 NOTE — Telephone Encounter (Signed)
A male left a message on voicemail stating that they were calling because they were trying to fax over a form to the office and is not able to get the fax to go thru. A request was left to call back [phone] and reference number [id number], no patient name or date of birth was left on the message. Called [facility] back and spoke to [place] a Teacher, early years/pre and was given the patient's name and date of birth. Mardella Layman apologized for the person not leaving the appropriate information. Mardella Layman stated that they were suppose to have called the patient to confirm his allergies because they did not have any allergies on file for him. Confirmed with Mardella Layman the allergies that we have on file. Mardella Layman stated that they will follow-up with patient to confirm his allergies before sending Omeprazole out to him.  Mardella Layman stated that this call should never have been made to our office.

## 2019-07-19 ENCOUNTER — Ambulatory Visit: Attending: Internal Medicine

## 2019-07-19 DIAGNOSIS — Z23 Encounter for immunization: Secondary | ICD-10-CM

## 2019-07-19 NOTE — Progress Notes (Signed)
   Covid-19 Vaccination Clinic  Name:  Phillip Gomez    MRN: [id number] DOB: 25-May-1965  07/19/2019  [name] was observed post Covid-19 immunization for 15 minutes without incident. He was provided with Vaccine Information Sheet and instruction to access the V-Safe system.   [name] was instructed to call 911 with any severe reactions post vaccine: Marland Kitchen Difficulty breathing  . Swelling of face and throat  . A fast heartbeat  . A bad rash all over body  . Dizziness and weakness   Immunizations Administered    Name Date Dose VIS Date Route   Pfizer COVID-19 Vaccine 07/19/2019  3:42 PM 0.3 mL 03/24/2019 Intramuscular   Manufacturer: [employer], [employer]   Lot: [id number]   NDC: [address]-6

## 2019-07-26 ENCOUNTER — Other Ambulatory Visit: Payer: Self-pay | Admitting: Family Medicine

## 2019-07-27 NOTE — Telephone Encounter (Signed)
Name of Medication: Methylphenidate Name of Pharmacy: [facility] Last Fill or Written Date and Quantity: 06/26/19 #30 Last Office Visit and Type: 02/07/19, CPE Next Office Visit and Type: 02/12/20, CPE Last Controlled Substance Agreement Date: 05/24/17 Last UDS: 02/07/19

## 2019-07-28 MED ORDER — METHYLPHENIDATE HCL ER (OSM) 27 MG PO TBCR: 27.0000 mg | EXTENDED_RELEASE_TABLET | Freq: Every day | ORAL | 0 refills | Status: DC

## 2019-07-28 NOTE — Telephone Encounter (Signed)
ERx 

## 2019-08-27 ENCOUNTER — Other Ambulatory Visit: Payer: Self-pay | Admitting: Family Medicine

## 2019-08-28 MED ORDER — METHYLPHENIDATE HCL ER (OSM) 27 MG PO TBCR: 27.0000 mg | EXTENDED_RELEASE_TABLET | Freq: Every day | ORAL | 0 refills | Status: DC

## 2019-08-28 NOTE — Telephone Encounter (Signed)
Plz address in Dr. Timoteo Expose absence.  Name of Medication: Methylphenidate Name of Pharmacy: [facility] Last Fill or Written Date and Quantity: 07/28/19, #30 Last Office Visit and Type: 02/07/19, CPE Next Office Visit and Type: 02/12/20, CPE Last Controlled Substance Agreement Date: 05/24/17 Last UDS: 02/07/19

## 2019-09-26 ENCOUNTER — Other Ambulatory Visit: Payer: Self-pay | Admitting: Family Medicine

## 2019-09-29 MED ORDER — METHYLPHENIDATE HCL ER (OSM) 27 MG PO TBCR: 27.0000 mg | EXTENDED_RELEASE_TABLET | Freq: Every day | ORAL | 0 refills | Status: DC

## 2019-09-29 NOTE — Telephone Encounter (Signed)
ERx 

## 2019-10-26 ENCOUNTER — Other Ambulatory Visit: Payer: Self-pay | Admitting: Family Medicine

## 2019-10-26 NOTE — Telephone Encounter (Signed)
Name of Medication: Methylphenidate Name of Pharmacy: [facility] Last Fill or Written Date and Quantity: 09/29/19, #30 Last Office Visit and Type: 02/07/19, CPE Next Office Visit and Type: 02/12/20, CPE Last Controlled Substance Agreement Date: 05/24/17 Last UDS: 02/07/19

## 2019-10-27 MED ORDER — METHYLPHENIDATE HCL ER (OSM) 27 MG PO TBCR: 27.0000 mg | EXTENDED_RELEASE_TABLET | Freq: Every day | ORAL | 0 refills | Status: DC

## 2019-10-27 NOTE — Telephone Encounter (Signed)
ERx 

## 2019-11-24 ENCOUNTER — Other Ambulatory Visit: Payer: Self-pay | Admitting: Family Medicine

## 2019-11-24 MED ORDER — METHYLPHENIDATE HCL ER (OSM) 27 MG PO TBCR: 27.0000 mg | EXTENDED_RELEASE_TABLET | Freq: Every day | ORAL | 0 refills | Status: DC

## 2019-11-24 NOTE — Telephone Encounter (Signed)
Name of Medication: Methylphenidate Name of Pharmacy: [facility] Last Fill or Written Date and Quantity: 10/27/19, #30 Last Office Visit and Type: 02/07/19, CPE Next Office Visit and Type: 02/12/20, CPE Last Controlled Substance Agreement Date: 05/24/17 Last UDS: 02/07/19

## 2019-11-24 NOTE — Telephone Encounter (Signed)
ERx 

## 2019-12-27 ENCOUNTER — Other Ambulatory Visit: Payer: Self-pay | Admitting: Family Medicine

## 2019-12-27 ENCOUNTER — Ambulatory Visit (INDEPENDENT_AMBULATORY_CARE_PROVIDER_SITE_OTHER)

## 2019-12-27 DIAGNOSIS — Z23 Encounter for immunization: Secondary | ICD-10-CM

## 2019-12-28 MED ORDER — METHYLPHENIDATE HCL ER (OSM) 27 MG PO TBCR: 27.0000 mg | EXTENDED_RELEASE_TABLET | Freq: Every day | ORAL | 0 refills | Status: DC

## 2019-12-28 NOTE — Telephone Encounter (Signed)
Name of Medication: Methylphenidate Name of Pharmacy: [facility] Last Fill or Written Date and Quantity: 11/24/19, #30 Last Office Visit and Type: 02/07/19, CPE Next Office Visit and Type: 02/12/20, CPE Last Controlled Substance Agreement Date: 05/24/17 Last UDS: 02/07/19

## 2019-12-28 NOTE — Telephone Encounter (Signed)
ERx 

## 2020-01-02 ENCOUNTER — Other Ambulatory Visit: Payer: Self-pay | Admitting: Family Medicine

## 2020-01-29 ENCOUNTER — Other Ambulatory Visit: Payer: Self-pay | Admitting: Family Medicine

## 2020-01-29 NOTE — Telephone Encounter (Signed)
Last OV 02/07/20 Last fill 12/28/19  #30/0 Please advise.

## 2020-01-30 ENCOUNTER — Other Ambulatory Visit: Payer: Self-pay | Admitting: Family Medicine

## 2020-01-31 MED ORDER — METHYLPHENIDATE HCL ER (OSM) 27 MG PO TBCR: 27.0000 mg | EXTENDED_RELEASE_TABLET | Freq: Every day | ORAL | 0 refills | Status: DC

## 2020-01-31 NOTE — Telephone Encounter (Signed)
ERx 

## 2020-02-01 NOTE — Telephone Encounter (Signed)
Duplicate request.  Refill sent 01/31/20, #30/0.    Plz deny.

## 2020-02-06 ENCOUNTER — Other Ambulatory Visit: Payer: Self-pay | Admitting: Family Medicine

## 2020-02-06 DIAGNOSIS — E039 Hypothyroidism, unspecified: Secondary | ICD-10-CM

## 2020-02-06 DIAGNOSIS — E785 Hyperlipidemia, unspecified: Secondary | ICD-10-CM

## 2020-02-06 DIAGNOSIS — Z1159 Encounter for screening for other viral diseases: Secondary | ICD-10-CM

## 2020-02-07 ENCOUNTER — Other Ambulatory Visit

## 2020-02-12 ENCOUNTER — Encounter: Admitting: Family Medicine

## 2020-02-22 ENCOUNTER — Other Ambulatory Visit: Payer: Self-pay | Admitting: Family Medicine

## 2020-02-22 MED ORDER — METHYLPHENIDATE HCL ER (OSM) 27 MG PO TBCR
27.0000 mg | EXTENDED_RELEASE_TABLET | Freq: Every day | ORAL | 0 refills | Status: DC
Start: 2020-02-22 — End: 2020-03-29

## 2020-02-22 NOTE — Telephone Encounter (Signed)
ERx 

## 2020-02-26 ENCOUNTER — Other Ambulatory Visit: Payer: Self-pay | Admitting: Family Medicine

## 2020-02-26 NOTE — Telephone Encounter (Signed)
Pharmacy requests refill on: Methylphenidate 27 mg  LAST REFILL: 02/22/2020 LAST OV: 02/07/2019 NEXT OV: 03/29/2020 PHARMACY: [id number] [place], [place]

## 2020-02-28 ENCOUNTER — Encounter: Payer: Self-pay | Admitting: Family Medicine

## 2020-02-28 NOTE — Telephone Encounter (Signed)
Spoke with [facility] asking about rx, which was sent 02/22/20.  Says it was placed on hold since last filled date was 01/31/20.  Ins co said it was too early.  However, they put it through today and ins co allowed it to go through.

## 2020-03-07 ENCOUNTER — Other Ambulatory Visit: Payer: Self-pay | Admitting: Family Medicine

## 2020-03-25 ENCOUNTER — Other Ambulatory Visit (INDEPENDENT_AMBULATORY_CARE_PROVIDER_SITE_OTHER)

## 2020-03-25 ENCOUNTER — Other Ambulatory Visit: Payer: Self-pay

## 2020-03-25 DIAGNOSIS — Z1159 Encounter for screening for other viral diseases: Secondary | ICD-10-CM

## 2020-03-25 DIAGNOSIS — E039 Hypothyroidism, unspecified: Secondary | ICD-10-CM

## 2020-03-25 DIAGNOSIS — E785 Hyperlipidemia, unspecified: Secondary | ICD-10-CM

## 2020-03-25 NOTE — Addendum Note (Signed)
Addended by: Alvina Chou on: 03/25/2020 07:43 AM   Modules accepted: Orders

## 2020-03-26 LAB — LIPID PANEL
Chol/HDL Ratio: 4.1 ratio (ref 0.0–5.0)
Cholesterol, Total: 149 mg/dL (ref 100–199)
HDL: 36 mg/dL — ABNORMAL LOW (ref >39)
LDL Chol Calc (NIH): 88 mg/dL (ref 0–99)
Triglycerides: 139 mg/dL (ref 0–149)
VLDL Cholesterol Cal: 25 mg/dL (ref 5–40)

## 2020-03-26 LAB — T4, FREE: Free T4: 1.29 ng/dL (ref 0.82–1.77)

## 2020-03-26 LAB — COMPREHENSIVE METABOLIC PANEL
ALT: 24 IU/L (ref 0–44)
AST: 21 IU/L (ref 0–40)
Albumin/Globulin Ratio: 1.4 (ref 1.2–2.2)
Albumin: 4.1 g/dL (ref 3.8–4.9)
Alkaline Phosphatase: 106 IU/L (ref 44–121)
BUN/Creatinine Ratio: 19 (ref 9–20)
BUN: 21 mg/dL (ref 6–24)
Bilirubin Total: 0.2 mg/dL (ref 0.0–1.2)
CO2: 27 mmol/L (ref 20–29)
Calcium: 9.4 mg/dL (ref 8.7–10.2)
Chloride: 103 mmol/L (ref 96–106)
Creatinine, Ser: 1.13 mg/dL (ref 0.76–1.27)
GFR calc Af Amer: 85 mL/min/{1.73_m2} (ref >59)
GFR calc non Af Amer: 74 mL/min/{1.73_m2} (ref >59)
Globulin, Total: 3 g/dL (ref 1.5–4.5)
Glucose: 98 mg/dL (ref 65–99)
Potassium: 5 mmol/L (ref 3.5–5.2)
Sodium: 141 mmol/L (ref 134–144)
Total Protein: 7.1 g/dL (ref 6.0–8.5)

## 2020-03-26 LAB — HEPATITIS C ANTIBODY: Hep C Virus Ab: 0.2 s/co ratio (ref 0.0–0.9)

## 2020-03-26 LAB — TSH: TSH: 5.11 u[IU]/mL — ABNORMAL HIGH (ref 0.450–4.500)

## 2020-03-27 ENCOUNTER — Other Ambulatory Visit: Payer: Self-pay | Admitting: Family Medicine

## 2020-03-27 NOTE — Telephone Encounter (Signed)
Name of Medication: Methylphenidate Name of Pharmacy: [facility] Last Fill or Written Date and Quantity: 02/22/20, #30 Last Office Visit and Type: 02/07/19, CPE Next Office Visit and Type: 03/29/20, CPE Last Controlled Substance Agreement Date: 05/24/17 Last UDS: 02/07/19

## 2020-03-29 ENCOUNTER — Ambulatory Visit (INDEPENDENT_AMBULATORY_CARE_PROVIDER_SITE_OTHER): Admitting: Family Medicine

## 2020-03-29 ENCOUNTER — Encounter: Payer: Self-pay | Admitting: Family Medicine

## 2020-03-29 ENCOUNTER — Other Ambulatory Visit: Payer: Self-pay

## 2020-03-29 VITALS — BP 122/78 | HR 68 | Temp 97.8°F | Ht 72.5 in | Wt 241.2 lb

## 2020-03-29 DIAGNOSIS — N281 Cyst of kidney, acquired: Secondary | ICD-10-CM

## 2020-03-29 DIAGNOSIS — E66811 Obesity, class 1: Secondary | ICD-10-CM

## 2020-03-29 DIAGNOSIS — E039 Hypothyroidism, unspecified: Secondary | ICD-10-CM | POA: Diagnosis not present

## 2020-03-29 DIAGNOSIS — Z23 Encounter for immunization: Secondary | ICD-10-CM

## 2020-03-29 DIAGNOSIS — Z125 Encounter for screening for malignant neoplasm of prostate: Secondary | ICD-10-CM

## 2020-03-29 DIAGNOSIS — E785 Hyperlipidemia, unspecified: Secondary | ICD-10-CM

## 2020-03-29 DIAGNOSIS — K760 Fatty (change of) liver, not elsewhere classified: Secondary | ICD-10-CM

## 2020-03-29 DIAGNOSIS — E669 Obesity, unspecified: Secondary | ICD-10-CM

## 2020-03-29 DIAGNOSIS — N289 Disorder of kidney and ureter, unspecified: Secondary | ICD-10-CM

## 2020-03-29 DIAGNOSIS — K219 Gastro-esophageal reflux disease without esophagitis: Secondary | ICD-10-CM

## 2020-03-29 DIAGNOSIS — F988 Other specified behavioral and emotional disorders with onset usually occurring in childhood and adolescence: Secondary | ICD-10-CM | POA: Diagnosis not present

## 2020-03-29 DIAGNOSIS — Z0000 Encounter for general adult medical examination without abnormal findings: Secondary | ICD-10-CM

## 2020-03-29 LAB — POC URINALSYSI DIPSTICK (AUTOMATED)
Bilirubin, UA: NEGATIVE
Blood, UA: NEGATIVE
Glucose, UA: NEGATIVE
Ketones, UA: NEGATIVE
Leukocytes, UA: NEGATIVE
Nitrite, UA: NEGATIVE
Protein, UA: POSITIVE — AB
Spec Grav, UA: 1.025 (ref 1.010–1.025)
Urobilinogen, UA: 0.2 E.U./dL
pH, UA: 7 (ref 5.0–8.0)

## 2020-03-29 MED ORDER — FLUOXETINE HCL 20 MG PO CAPS: ORAL_CAPSULE | ORAL | 3 refills | Status: DC

## 2020-03-29 MED ORDER — METHYLPHENIDATE HCL ER (OSM) 27 MG PO TBCR: 27.0000 mg | EXTENDED_RELEASE_TABLET | Freq: Every day | ORAL | 0 refills | Status: DC

## 2020-03-29 MED ORDER — LEVOTHYROXINE SODIUM 137 MCG PO TABS: 137.0000 ug | ORAL_TABLET | Freq: Every day | ORAL | 3 refills | Status: DC

## 2020-03-29 MED ORDER — OMEPRAZOLE 20 MG PO CPDR: 20.0000 mg | DELAYED_RELEASE_CAPSULE | Freq: Every day | ORAL | 3 refills | Status: DC

## 2020-03-29 MED ORDER — FLUOXETINE HCL 10 MG PO CAPS: 10.0000 mg | ORAL_CAPSULE | Freq: Every day | ORAL | 3 refills | Status: DC

## 2020-03-29 NOTE — Assessment & Plan Note (Signed)
TSH elevated, fT4 normal. Denies hypothyroid symptoms.  I asked him to return in 2 wks to rpt TFTs.

## 2020-03-29 NOTE — Patient Instructions (Addendum)
Shingrix vaccine today. Return in 2-6 months for nurse visit to complete shingles series.  Return in 2 weeks for repeat lab visit to check thyroid and prostate levels. If thyroid staying low, we may increase thyroid medicine dose.  Good to see you today. Return as needed or in 1 year for next physical.   Health Maintenance, Male Adopting a healthy lifestyle and getting preventive care are important in promoting health and wellness. Ask your health care provider about:  The right schedule for you to have regular tests and exams.  Things you can do on your own to prevent diseases and keep yourself healthy. What should I know about diet, weight, and exercise? Eat a healthy diet   Eat a diet that includes plenty of vegetables, fruits, low-fat dairy products, and lean protein.  Do not eat a lot of foods that are high in solid fats, added sugars, or sodium. Maintain a healthy weight Body mass index (BMI) is a measurement that can be used to identify possible weight problems. It estimates body fat based on height and weight. Your health care provider can help determine your BMI and help you achieve or maintain a healthy weight. Get regular exercise Get regular exercise. This is one of the most important things you can do for your health. Most adults should:  Exercise for at least 150 minutes each week. The exercise should increase your heart rate and make you sweat (moderate-intensity exercise).  Do strengthening exercises at least twice a week. This is in addition to the moderate-intensity exercise.  Spend less time sitting. Even light physical activity can be beneficial. Watch cholesterol and blood lipids Have your blood tested for lipids and cholesterol at 53 years of age, then have this test every 5 years. You may need to have your cholesterol levels checked more often if:  Your lipid or cholesterol levels are high.  You are older than 53 years of age.  You are at high risk for heart  disease. What should I know about cancer screening? Many types of cancers can be detected early and may often be prevented. Depending on your health history and family history, you may need to have cancer screening at various ages. This may include screening for:  Colorectal cancer.  Prostate cancer.  Skin cancer.  Lung cancer. What should I know about heart disease, diabetes, and high blood pressure? Blood pressure and heart disease  High blood pressure causes heart disease and increases the risk of stroke. This is more likely to develop in people who have high blood pressure readings, are of African descent, or are overweight.  Talk with your health care provider about your target blood pressure readings.  Have your blood pressure checked: ? Every 3-5 years if you are 41-61 years of age. ? Every year if you are 83 years old or older.  If you are between the ages of 47 and 39 and are a current or former smoker, ask your health care provider if you should have a one-time screening for abdominal aortic aneurysm (AAA). Diabetes Have regular diabetes screenings. This checks your fasting blood sugar level. Have the screening done:  Once every three years after age 35 if you are at a normal weight and have a low risk for diabetes.  More often and at a younger age if you are overweight or have a high risk for diabetes. What should I know about preventing infection? Hepatitis B If you have a higher risk for hepatitis B, you should  be screened for this virus. Talk with your health care provider to find out if you are at risk for hepatitis B infection. Hepatitis C Blood testing is recommended for:  Everyone born from [birth year] through 1964.  Anyone with known risk factors for hepatitis C. Sexually transmitted infections (STIs)  You should be screened each year for STIs, including gonorrhea and chlamydia, if: ? You are sexually active and are younger than 53 years of age. ? You are older  than 53 years of age and your health care provider tells you that you are at risk for this type of infection. ? Your sexual activity has changed since you were last screened, and you are at increased risk for chlamydia or gonorrhea. Ask your health care provider if you are at risk.  Ask your health care provider about whether you are at high risk for HIV. Your health care provider may recommend a prescription medicine to help prevent HIV infection. If you choose to take medicine to prevent HIV, you should first get tested for HIV. You should then be tested every 3 months for as long as you are taking the medicine. Follow these instructions at home: Lifestyle  Do not use any products that contain nicotine or tobacco, such as cigarettes, e-cigarettes, and chewing tobacco. If you need help quitting, ask your health care provider.  Do not use street drugs.  Do not share needles.  Ask your health care provider for help if you need support or information about quitting drugs. Alcohol use  Do not drink alcohol if your health care provider tells you not to drink.  If you drink alcohol: ? Limit how much you have to 0-2 drinks a day. ? Be aware of how much alcohol is in your drink. In the [place] one drink equals one 12 oz bottle of beer (355 mL), one 5 oz glass of wine (148 mL), or one 1 oz glass of hard liquor (44 mL). General instructions  Schedule regular health, dental, and eye exams.  Stay current with your vaccines.  Tell your health care provider if: ? You often feel depressed. ? You have ever been abused or do not feel safe at home. Summary  Adopting a healthy lifestyle and getting preventive care are important in promoting health and wellness.  Follow your health care provider's instructions about healthy diet, exercising, and getting tested or screened for diseases.  Follow your health care provider's instructions on monitoring your cholesterol and blood pressure. This  information is not intended to replace advice given to you by your health care provider. Make sure you discuss any questions you have with your health care provider. Document Revised: 03/23/2018 Document Reviewed: 03/23/2018 Elsevier Patient Education  2020 [employer].

## 2020-03-29 NOTE — Assessment & Plan Note (Signed)
Weight gain noted. Encouraged healthy diet and lifestyle changes to affect sustainable weight loss.  

## 2020-03-29 NOTE — Assessment & Plan Note (Signed)
Continues daily omeprazole 20mg .  

## 2020-03-29 NOTE — Progress Notes (Signed)
Patient ID: Phillip Gomez, male    DOB: Oct 02, 1965, 54 y.o.   MRN: [id number]  This visit was conducted in person.  BP 122/78 (BP Location: Left Arm, Patient Position: Sitting, Cuff Size: Large)   Pulse 68   Temp 97.8 F (36.6 C) (Temporal)   Ht 6' 0.5" (1.842 m)   Wt 241 lb 3 oz (109.4 kg)   SpO2 97%   BMI 32.26 kg/m    CC: CPE Subjective:   HPI: Phillip Gomez is a 54 y.o. male presenting on 03/29/2020 for Annual Exam   Brother passed away last month from metastatic prostate cancer (68yo).  94yo father lives in [place], recovered well from recent COVID.   Notes trouble with sleep initiation insomnia. Has bedtime routine. Takes concerta first thing in the morning.   ADD - tolerating methylphenidate well. Denies headache, loss of appetite, or chest pain. Does not do stimulant holidays.Finds it really helps energy level.   Poorly healing scab to SK on back present for months. rec bandaid daily for protection.   Preventative: Colon cancer screening -colonsocopy 02/2017 - 3 polyps, rpt 5 yrs (through [place])  Prostate cancer screening -discussed, fmhx prostate cancer, will continue yearly screens.  Flu shotyearly COVID vaccine Pfizer 06/2019, 07/2019, 12/2019 Tdap 2014, 11/2017, 11/2018 Shingrix - discussed, will [facility] Seat belt use discussed Sunscreen use discussed. No changing moles on skin.  Non smoker  Alcohol - rare Dentist q6 mo Eye exam yearly   Lives with wife and FIL, MIL, occasionally step son Occupation: owns business - Air cabin crew. Retired from [employer] Activity: stays active at work  Diet: good water, fruits/vegetables some     Relevant past medical, surgical, family and social history reviewed and updated as indicated. Interim medical history since our last visit reviewed. Allergies and medications reviewed and updated. Outpatient Medications Prior to Visit  Medication Sig Dispense Refill  . FLUoxetine (PROZAC) 10 MG capsule Take 1  capsule (10 mg total) by mouth daily. (Take with 20 mg capsule) 90 capsule 3  . FLUoxetine (PROZAC) 20 MG capsule TAKE 1 CAPSULE DAILY. (TAKE WITH 10 MG CAPSULE) 90 capsule 3  . levothyroxine (SYNTHROID) 137 MCG tablet Take 1 tablet (137 mcg total) by mouth daily before breakfast. NEEDS OFFICE VISIT 90 tablet 0  . methylphenidate 27 MG PO CR tablet Take 1 tablet (27 mg total) by mouth daily. 30 tablet 0  . omeprazole (PRILOSEC) 20 MG capsule TAKE 1 CAPSULE DAILY 90 capsule 0   No facility-administered medications prior to visit.     Per HPI unless specifically indicated in ROS section below Review of Systems  Constitutional: Negative for activity change, appetite change, chills, fatigue, fever and unexpected weight change.  HENT: Negative for hearing loss.   Eyes: Negative for visual disturbance.  Respiratory: Negative for cough, chest tightness, shortness of breath and wheezing.   Cardiovascular: Negative for chest pain, palpitations and leg swelling.  Gastrointestinal: Negative for abdominal distention, abdominal pain, blood in stool, constipation, diarrhea, nausea and vomiting.  Genitourinary: Negative for difficulty urinating and hematuria.  Musculoskeletal: Negative for arthralgias, myalgias and neck pain.  Skin: Negative for rash.  Neurological: Negative for dizziness, seizures, syncope and headaches.  Hematological: Negative for adenopathy. Does not bruise/bleed easily.  Psychiatric/Behavioral: Negative for dysphoric mood. The patient is not nervous/anxious.    Objective:  BP 122/78 (BP Location: Left Arm, Patient Position: Sitting, Cuff Size: Large)   Pulse 68   Temp 97.8 F (36.6 C) (Temporal)  Ht 6' 0.5" (1.842 m)   Wt 241 lb 3 oz (109.4 kg)   SpO2 97%   BMI 32.26 kg/m   Wt Readings from Last 3 Encounters:  03/29/20 241 lb 3 oz (109.4 kg)  02/07/19 231 lb 7 oz (105 kg)  12/07/17 235 lb (106.6 kg)      Physical Exam Vitals and nursing note reviewed.   Constitutional:      General: He is not in acute distress.    Appearance: Normal appearance. He is well-developed and well-nourished. He is not ill-appearing.  HENT:     Head: Normocephalic and atraumatic.     Right Ear: Hearing, tympanic membrane, ear canal and external ear normal.     Left Ear: Hearing, tympanic membrane, ear canal and external ear normal.     Nose: Nose normal.     Mouth/Throat:     Mouth: Oropharynx is clear and moist and mucous membranes are normal.     Pharynx: No posterior oropharyngeal edema.  Eyes:     General: No scleral icterus.    Extraocular Movements: EOM normal.     Conjunctiva/sclera: Conjunctivae normal.     Pupils: Pupils are equal, round, and reactive to light.  Neck:     Thyroid: No thyroid mass or thyromegaly.  Cardiovascular:     Rate and Rhythm: Normal rate and regular rhythm.     Pulses: Normal pulses and intact distal pulses.          Radial pulses are 2+ on the right side and 2+ on the left side.     Heart sounds: Normal heart sounds. No murmur heard.   Pulmonary:     Effort: Pulmonary effort is normal. No respiratory distress.     Breath sounds: Normal breath sounds. No wheezing, rhonchi or rales.  Abdominal:     General: Abdomen is flat. Bowel sounds are normal. There is no distension.     Palpations: Abdomen is soft. There is no mass.     Tenderness: There is no abdominal tenderness. There is no guarding or rebound.     Hernia: No hernia is present.  Genitourinary:    Prostate: Enlarged (30gm, mild R lobe enlargement). Not tender and no nodules present.     Rectum: Normal. No mass, tenderness, anal fissure, external hemorrhoid or internal hemorrhoid. Normal anal tone.  Musculoskeletal:        General: No edema. Normal range of motion.     Cervical back: Normal range of motion and neck supple.     Right lower leg: No edema.     Left lower leg: No edema.  Lymphadenopathy:     Cervical: No cervical adenopathy.  Skin:    General:  Skin is warm and dry.     Findings: No rash.  Neurological:     General: No focal deficit present.     Mental Status: He is alert and oriented to person, place, and time.     Comments: CN grossly intact, station and gait intact  Psychiatric:        Mood and Affect: Mood and affect and mood normal.        Behavior: Behavior normal.        Thought Content: Thought content normal.        Judgment: Judgment normal.       Results for orders placed or performed in visit on 03/29/20  POCT Urinalysis Dipstick (Automated)  Result Value Ref Range   Color, UA light yellow  Clarity, UA clear    Glucose, UA Negative Negative   Bilirubin, UA negative    Ketones, UA negative    Spec Grav, UA 1.025 1.010 - 1.025   Blood, UA negative    pH, UA 7.0 5.0 - 8.0   Protein, UA Positive (A) Negative   Urobilinogen, UA 0.2 0.2 or 1.0 E.U./dL   Nitrite, UA negative    Leukocytes, UA Negative Negative   Assessment & Plan:  This visit occurred during the SARS-CoV-2 public health emergency.  Safety protocols were in place, including screening questions prior to the visit, additional usage of staff PPE, and extensive cleaning of exam room while observing appropriate contact time as indicated for disinfecting solutions.   Problem List Items Addressed This Visit    Obesity, Class I, BMI 30.0-34.9 (see actual BMI)    Weight gain noted. Encouraged healthy diet and lifestyle changes to affect sustainable weight loss.       Kidney lesion, native, left    Update renal CT for Bosinak 81F renal lesion.  Check UA today.       Relevant Orders   CT ABDOMEN W WO CONTRAST   Hypothyroidism    TSH elevated, fT4 normal. Denies hypothyroid symptoms.  I asked him to return in 2 wks to rpt TFTs.       Relevant Medications   levothyroxine (SYNTHROID) 137 MCG tablet   Other Relevant Orders   TSH   T4, free   Health maintenance examination - Primary    Preventative protocols reviewed and updated unless pt  declined. Discussed healthy diet and lifestyle.       GERD (gastroesophageal reflux disease)    Continues daily omeprazole 20mg        Relevant Medications   omeprazole (PRILOSEC) 20 MG capsule   Fatty liver    By imaging.       Dyslipidemia    Chronic, stable off medication. HDL low - encouraged increased aerobic exercise. The 10-year ASCVD risk score DC Denman George., et al., 2013) is: 4%   Values used to calculate the score:     Age: 49 years     Sex: Male     Is Non-Hispanic African American: No     Diabetic: No     Tobacco smoker: No     Systolic Blood Pressure: 122 mmHg     Is BP treated: No     HDL Cholesterol: 36 mg/dL     Total Cholesterol: 149 mg/dL       ADD (attention deficit disorder)    Stable period. Continue methylphenidate in the mornings.        Other Visit Diagnoses    Special screening for malignant neoplasm of prostate       Relevant Orders   PSA   Acquired cyst of kidney       Relevant Orders   CT ABDOMEN W WO CONTRAST   POCT Urinalysis Dipstick (Automated) (Completed)   Need for shingles vaccine       Relevant Orders   Varicella-zoster vaccine IM (Completed)       Meds ordered this encounter  Medications  . FLUoxetine (PROZAC) 10 MG capsule    Sig: Take 1 capsule (10 mg total) by mouth daily. (Take with 20 mg capsule)    Dispense:  90 capsule    Refill:  3  . FLUoxetine (PROZAC) 20 MG capsule    Sig: TAKE 1 CAPSULE DAILY. (TAKE WITH 10 MG CAPSULE)    Dispense:  90  capsule    Refill:  3  . levothyroxine (SYNTHROID) 137 MCG tablet    Sig: Take 1 tablet (137 mcg total) by mouth daily before breakfast.    Dispense:  90 tablet    Refill:  3  . methylphenidate 27 MG PO CR tablet    Sig: Take 1 tablet (27 mg total) by mouth daily.    Dispense:  30 tablet    Refill:  0  . omeprazole (PRILOSEC) 20 MG capsule    Sig: Take 1 capsule (20 mg total) by mouth daily.    Dispense:  90 capsule    Refill:  3   Orders Placed This Encounter   Procedures  . CT ABDOMEN W WO CONTRAST    Standing Status:   Future    Standing Expiration Date:   03/29/2021    Order Specific Question:   If indicated for the ordered procedure, I authorize the administration of contrast media per Radiology protocol    Answer:   Yes    Order Specific Question:   Preferred imaging location?    Answer:   [facility]    Order Specific Question:   Is Oral Contrast requested for this exam?    Answer:   Yes, Per Radiology protocol  . Varicella-zoster vaccine IM  . PSA    Standing Status:   Future    Standing Expiration Date:   03/29/2021  . TSH    Standing Status:   Future    Standing Expiration Date:   03/29/2021  . T4, free    Standing Status:   Future    Standing Expiration Date:   03/29/2021  . POCT Urinalysis Dipstick (Automated)    Patient instructions: Shingrix vaccine today. Return in 2-6 months for nurse visit to complete shingles series.  Return in 2 weeks for repeat lab visit to check thyroid and prostate levels. If thyroid staying low, we may increase thyroid medicine dose.  Good to see you today. Return as needed or in 1 year for next physical.   Follow up plan: Return in about 1 year (around 03/29/2021) for annual exam, prior fasting for blood work.  Eustaquio Boyden, MD

## 2020-03-29 NOTE — Assessment & Plan Note (Signed)
By imaging.

## 2020-03-29 NOTE — Assessment & Plan Note (Signed)
Stable period. Continue methylphenidate in the mornings.

## 2020-03-29 NOTE — Assessment & Plan Note (Signed)
Preventative protocols reviewed and updated unless pt declined. Discussed healthy diet and lifestyle.  

## 2020-03-29 NOTE — Assessment & Plan Note (Addendum)
Update renal CT for [name] renal lesion.  Check UA today.

## 2020-03-29 NOTE — Assessment & Plan Note (Signed)
Chronic, stable off medication. HDL low - encouraged increased aerobic exercise. The 10-year ASCVD risk score Denman George DC Montez Hageman., et al., 2013) is: 4%   Values used to calculate the score:     Age: 54 years     Sex: Male     Is Non-Hispanic African American: No     Diabetic: No     Tobacco smoker: No     Systolic Blood Pressure: 122 mmHg     Is BP treated: No     HDL Cholesterol: 36 mg/dL     Total Cholesterol: 149 mg/dL

## 2020-04-02 ENCOUNTER — Encounter: Payer: Self-pay | Admitting: Family Medicine

## 2020-04-02 MED ORDER — METHYLPHENIDATE HCL ER (OSM) 27 MG PO TBCR: 27.0000 mg | EXTENDED_RELEASE_TABLET | Freq: Every day | ORAL | 0 refills | Status: DC

## 2020-04-02 NOTE — Telephone Encounter (Signed)
Pt needs methylphenidate sent to [facility] due to [facility] no longer accepting [employer] ins.

## 2020-04-02 NOTE — Addendum Note (Signed)
Addended by: Nanci Pina on: 04/02/2020 04:19 PM   Modules accepted: Orders

## 2020-04-02 NOTE — Telephone Encounter (Signed)
Spoke with [facility] asking about rx.  Told they it's on hold.  [facility] no longer accepts [employer] ins.  [facility]   Will relay info to pt via MyChart.

## 2020-04-02 NOTE — Telephone Encounter (Signed)
ERx 

## 2020-04-16 ENCOUNTER — Other Ambulatory Visit: Payer: Self-pay

## 2020-04-16 ENCOUNTER — Ambulatory Visit
Admission: RE | Admit: 2020-04-16 | Discharge: 2020-04-16 | Disposition: A | Discharge status: Home / Self Care | Source: Ambulatory Visit | Attending: Family Medicine | Admitting: Family Medicine

## 2020-04-16 DIAGNOSIS — N281 Cyst of kidney, acquired: Secondary | ICD-10-CM | POA: Diagnosis present

## 2020-04-16 DIAGNOSIS — N289 Disorder of kidney and ureter, unspecified: Secondary | ICD-10-CM

## 2020-04-16 MED ORDER — IOHEXOL 300 MG/ML  SOLN
125.0000 mL | Freq: Once | INTRAMUSCULAR | Status: AC | PRN
  Administered 2020-04-16: 125 mL via INTRAVENOUS

## 2020-04-17 ENCOUNTER — Other Ambulatory Visit (INDEPENDENT_AMBULATORY_CARE_PROVIDER_SITE_OTHER)

## 2020-04-17 DIAGNOSIS — Z125 Encounter for screening for malignant neoplasm of prostate: Secondary | ICD-10-CM

## 2020-04-17 DIAGNOSIS — E039 Hypothyroidism, unspecified: Secondary | ICD-10-CM

## 2020-04-17 NOTE — Addendum Note (Signed)
Addended by: Alvina Chou on: 04/17/2020 07:42 AM   Modules accepted: Orders

## 2020-04-18 ENCOUNTER — Encounter: Payer: Self-pay | Admitting: Family Medicine

## 2020-04-18 LAB — T4, FREE: Free T4: 1.4 ng/dL (ref 0.82–1.77)

## 2020-04-18 LAB — TSH: TSH: 3.04 u[IU]/mL (ref 0.450–4.500)

## 2020-04-18 LAB — PSA: Prostate Specific Ag, Serum: 1.1 ng/mL (ref 0.0–4.0)

## 2020-04-24 ENCOUNTER — Other Ambulatory Visit: Payer: Self-pay | Admitting: Family Medicine

## 2020-04-25 NOTE — Telephone Encounter (Signed)
Name of Medication: Methylphenidate Name of Pharmacy: [facility] Last Fill or Written Date and Quantity: 04/02/20, #30 Last Office Visit and Type: 03/29/20, CPE Next Office Visit and Type: 04/02/21, CPE Last Controlled Substance Agreement Date: 05/24/17 Last UDS: 02/07/19

## 2020-04-27 MED ORDER — METHYLPHENIDATE HCL ER (OSM) 27 MG PO TBCR: 27.0000 mg | EXTENDED_RELEASE_TABLET | Freq: Every day | ORAL | 0 refills | Status: DC

## 2020-04-27 NOTE — Telephone Encounter (Signed)
ERx 

## 2020-05-31 ENCOUNTER — Other Ambulatory Visit: Payer: Self-pay | Admitting: Family Medicine

## 2020-05-31 NOTE — Telephone Encounter (Signed)
Pharmacy requests refill on: Methylphenidate 27 mg   LAST REFILL: 04/27/2020 (Q-30, R-0) LAST OV: 03/29/2020 NEXT OV: 04/02/2021 PHARMACY: [id number] [place], [place]

## 2020-06-01 MED ORDER — METHYLPHENIDATE HCL ER (OSM) 27 MG PO TBCR: 27.0000 mg | EXTENDED_RELEASE_TABLET | Freq: Every day | ORAL | 0 refills | Status: DC

## 2020-06-01 NOTE — Telephone Encounter (Signed)
ERx 

## 2020-06-17 IMAGING — CT CT ABDOMEN WO/W CM
3 of 12 series · 12 of 46 positions shown, 18 images · IV contrast (iopamidol)
Comparison: 08/04/2017

CLINICAL DATA: Followup kidney lesion. Bosniak category 2F lesion
arises from lower pole of left kidney.

EXAM:
CT ABDOMEN WITHOUT AND WITH CONTRAST
TECHNIQUE: Multidetector CT imaging of the abdomen was performed following the
standard protocol before and following the bolus administration of
intravenous contrast.
CONTRAST:  100mL [id number] IOPAMIDOL ([id number]) INJECTION 61%

[Series 2: axial without renal without · axial · non-contrast · 0.84mm/px · z∈[-1246,-996]mm · 6 of 175 slices shown, 11 images]
[im 25/175  soft-tissue]
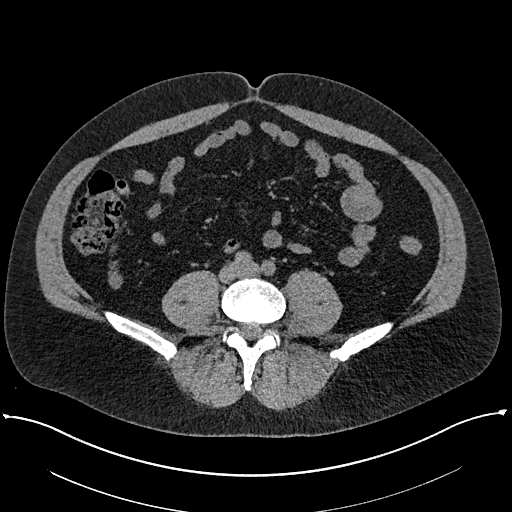
[im 25/175  bone]
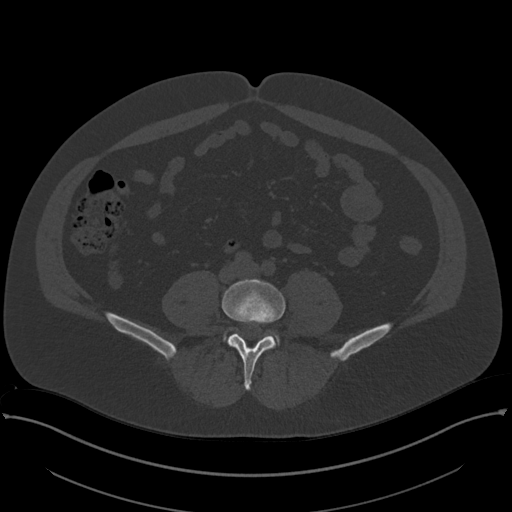
[im 50/175  soft-tissue]
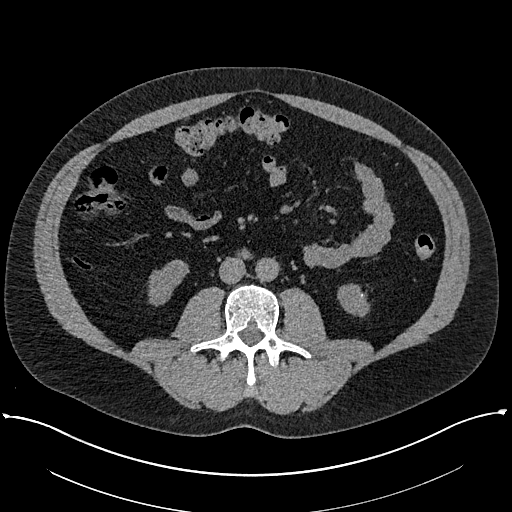
[im 75/175  soft-tissue]
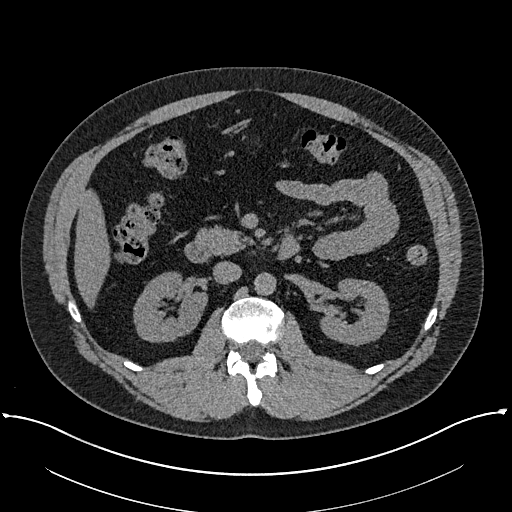
[im 75/175  lung]
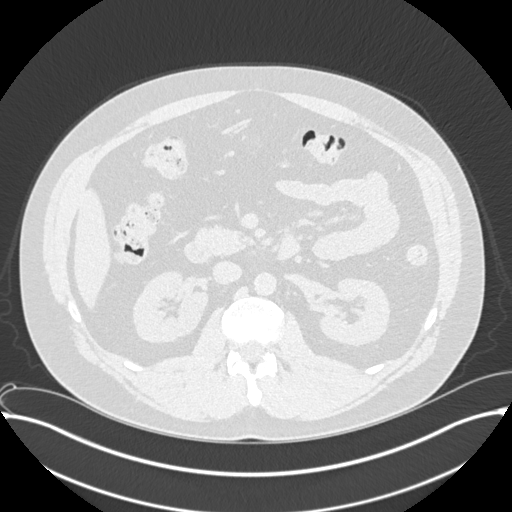
[im 100/175  soft-tissue]
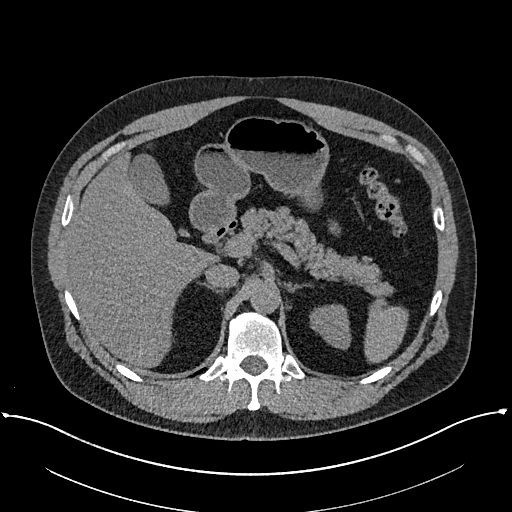
[im 100/175  lung]
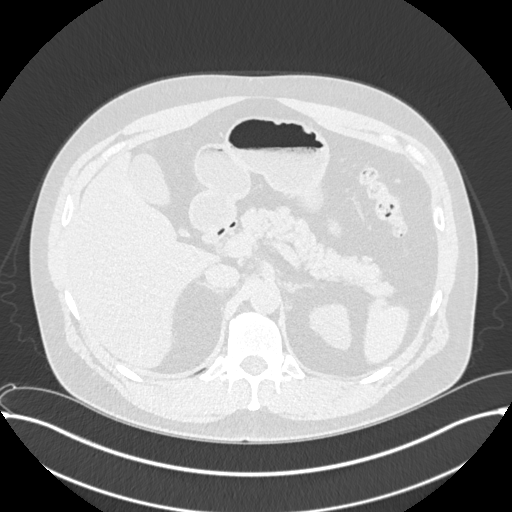
[im 125/175  soft-tissue]
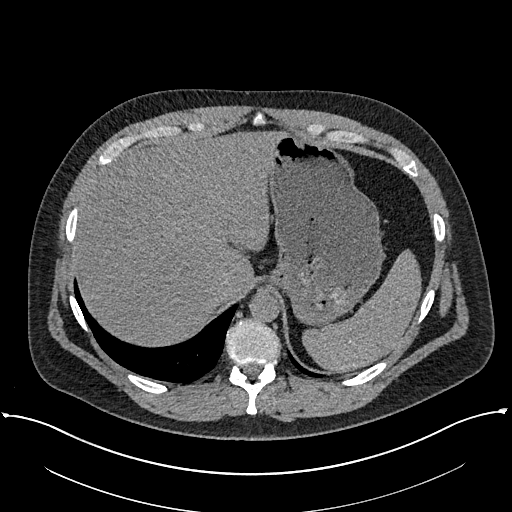
[im 125/175  lung]
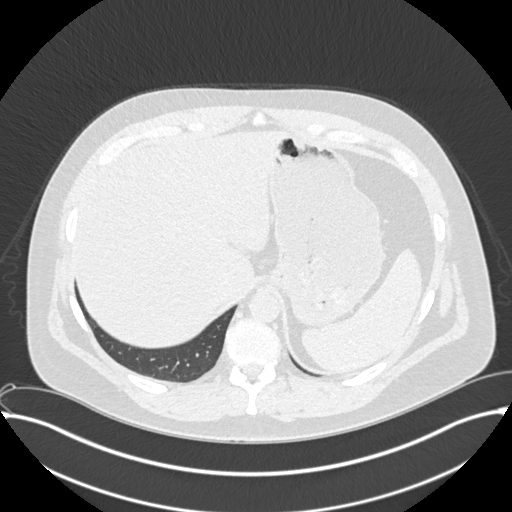
[im 150/175  soft-tissue]
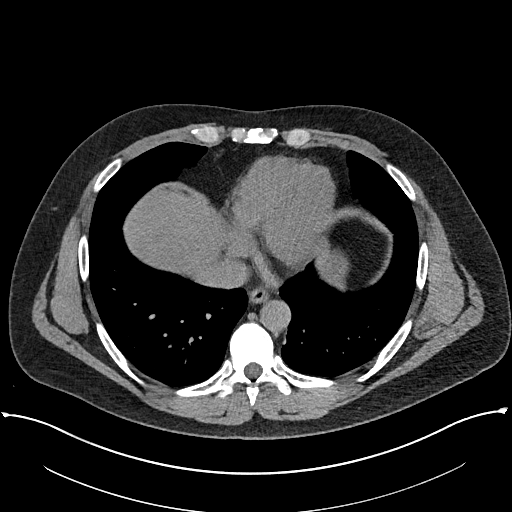
[im 150/175  lung]
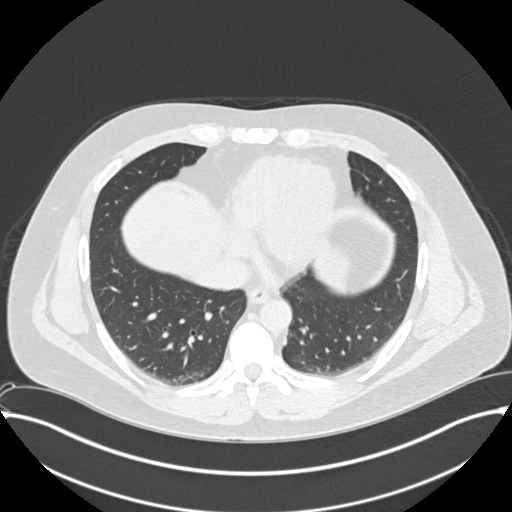

[Series 4: coronal without renal without · coronal · non-contrast · 0.69mm/px · 2 of 215 slices shown, 3 images]
[im 72/215  soft-tissue]
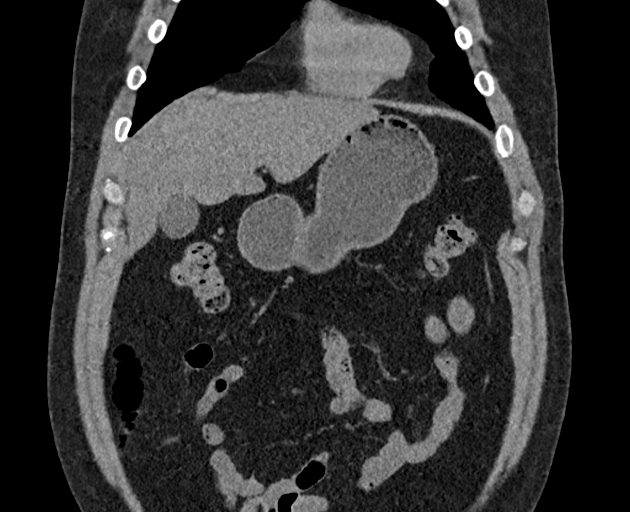
[im 72/215  bone]
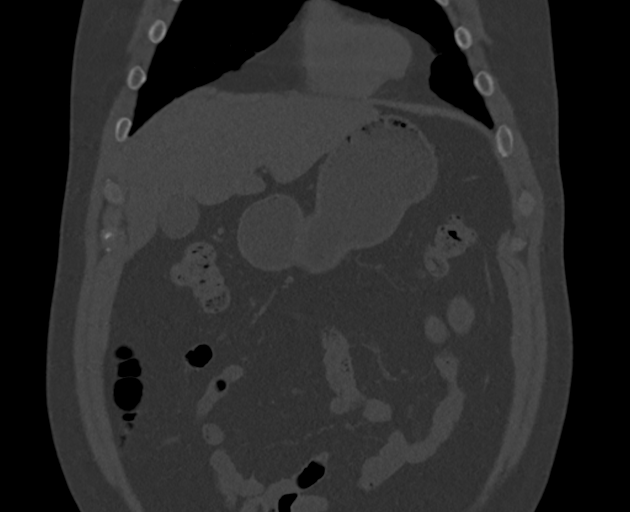
[im 143/215  soft-tissue]
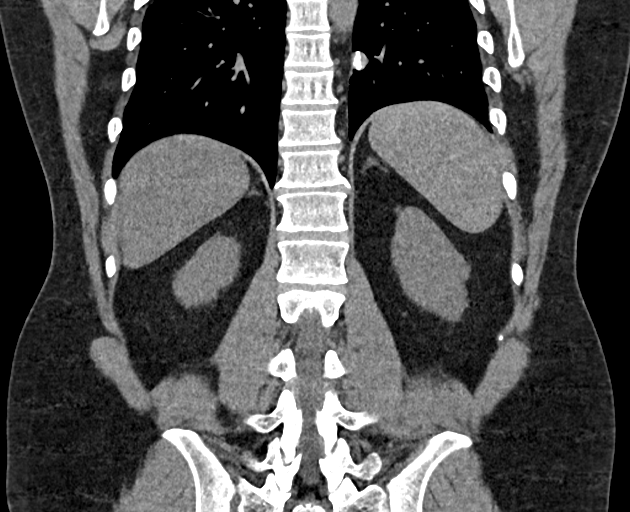

[Series 8: axial arterial renal arterial · axial · arterial · 0.84mm/px · z∈[-1236,-1062]mm · 4 of 175 slices shown]
[im 30/175  soft-tissue]
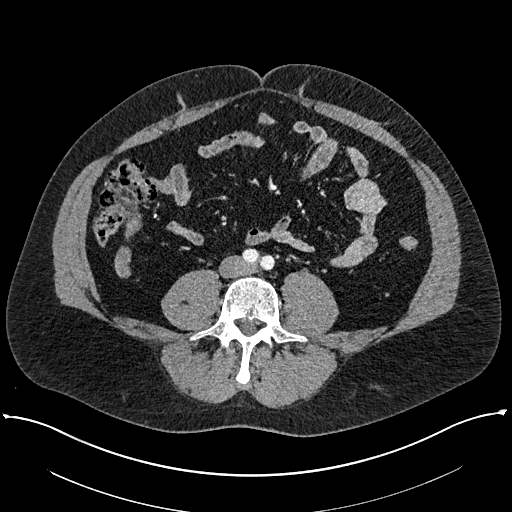
[im 59/175  soft-tissue]
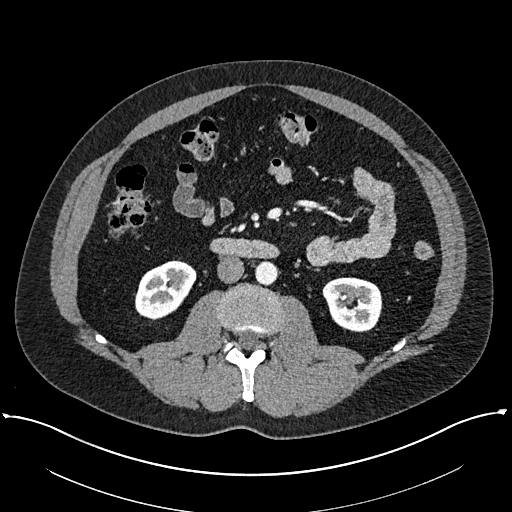
[im 88/175  soft-tissue]
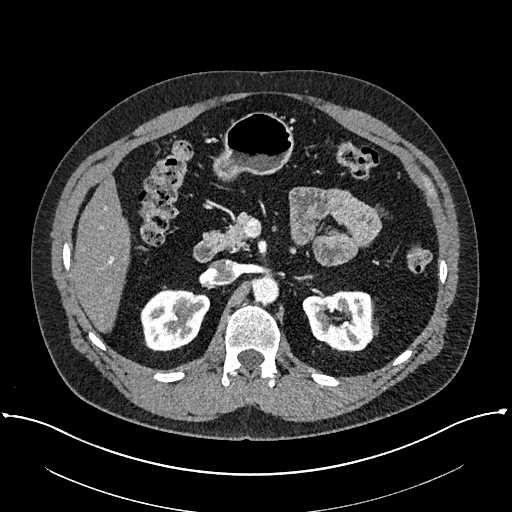
[im 117/175  soft-tissue]
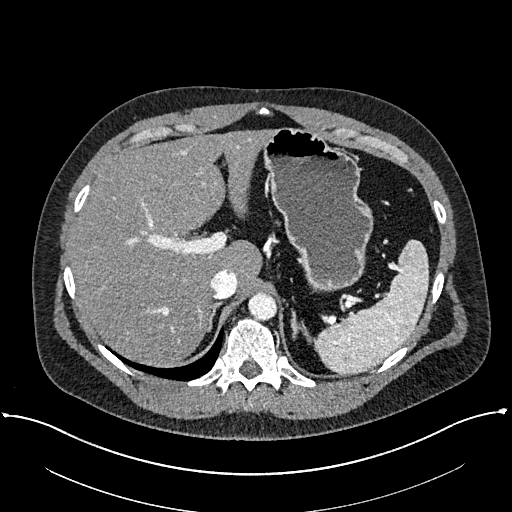

[12 of 46 positions shown; findings below may reference images not displayed]

FINDINGS: Lower chest: Calcified granuloma identified within the medial
subpleural left lower lobe. No acute abnormalities identified within
the lung bases.

Hepatobiliary: Diffuse hepatic steatosis identified. No focal liver
abnormality. Gallbladder normal. No biliary ductal dilatation.

Pancreas: Unremarkable. No pancreatic ductal dilatation or
surrounding inflammatory changes.

Spleen: Normal in size without focal abnormality.

Adrenals/Urinary Tract: Normal adrenal glands. The previously
categorized Bosniak category 2F arising from inferior pole of left
kidney measures 1.8 x 1.8 cm, image 124/17. Unchanged in size when
compared with the previous exam. This is mildly hyperdense on the
precontrast images and contains a few small peripheral foci of mural
calcification. Stable from previous exam. No internal areas of
thickened septation or enhancing mural nodularity. No additional
kidney lesions identified bilaterally. No kidney stones identified.

Stomach/Bowel: Stomach normal. The small bowel loops have a normal
course and caliber. No obstruction. The appendix is visualized and
appears unremarkable. Normal appearance of the colon.

Vascular/Lymphatic: Normal appearance of the abdominal aorta.. No
enlarged abdominal lymph nodes.

Other: There is no free fluid or fluid collections.

Musculoskeletal: No acute or significant osseous findings.
IMPRESSION: 1. Stable Bosniak category 2F lesion. Continued interval follow-up
with repeat renal protocol CT or MRI in 12 months is advised.
2. Hepatic steatosis.

## 2020-06-18 ENCOUNTER — Other Ambulatory Visit: Payer: Self-pay | Admitting: Family Medicine

## 2020-06-27 ENCOUNTER — Ambulatory Visit (INDEPENDENT_AMBULATORY_CARE_PROVIDER_SITE_OTHER)

## 2020-06-27 ENCOUNTER — Other Ambulatory Visit: Payer: Self-pay

## 2020-06-27 ENCOUNTER — Encounter: Payer: Self-pay | Admitting: Family Medicine

## 2020-06-27 DIAGNOSIS — Z23 Encounter for immunization: Secondary | ICD-10-CM

## 2020-06-28 MED ORDER — LEVOTHYROXINE SODIUM 137 MCG PO TABS: 137.0000 ug | ORAL_TABLET | Freq: Every day | ORAL | 0 refills | Status: DC

## 2020-06-28 NOTE — Telephone Encounter (Signed)
Rx was sent to [facility] on 03/29/20, #90/3.  I called [facility] and was on hold for 15 mins.  Had to disconnect call.  Please call Mon, 07/01/20 to see what is going on.  In the meantime, e-scribed 30-day supply to [facility], per pt request.  Spoke with pt and made him aware.

## 2020-07-01 ENCOUNTER — Other Ambulatory Visit: Payer: Self-pay | Admitting: Family Medicine

## 2020-07-02 NOTE — Telephone Encounter (Signed)
Refill request Methylphenidate Last refill 06/01/20 #30 Last office visit 03/29/20 Upcoming appointment 04/02/21

## 2020-07-03 ENCOUNTER — Other Ambulatory Visit: Payer: Self-pay | Admitting: Family Medicine

## 2020-07-04 ENCOUNTER — Encounter: Payer: Self-pay | Admitting: Family Medicine

## 2020-07-04 MED ORDER — METHYLPHENIDATE HCL ER (OSM) 27 MG PO TBCR: 27.0000 mg | EXTENDED_RELEASE_TABLET | Freq: Every day | ORAL | 0 refills | Status: DC

## 2020-07-04 NOTE — Telephone Encounter (Signed)
Duplicate will not let me refuse  

## 2020-07-04 NOTE — Telephone Encounter (Signed)
ERx 

## 2020-07-05 NOTE — Telephone Encounter (Signed)
Spoke with Jorja Loa of [facility] asking about rx that was sent 03/29/20 for levothyroxine 137 mcg tab.  States rx was received and is on hold due to a billing issue that pt needs to resolve with them.  Then they will ship med.  Notified pt, via MyChart, of info above.

## 2020-07-19 ENCOUNTER — Other Ambulatory Visit: Payer: Self-pay

## 2020-07-19 MED ORDER — LEVOTHYROXINE SODIUM 137 MCG PO TABS: 137.0000 ug | ORAL_TABLET | Freq: Every day | ORAL | 2 refills | Status: DC

## 2020-07-19 NOTE — Telephone Encounter (Signed)
Received a refill request from [facility] for levothyroxine 137 mcg.  E-scribed refill.

## 2020-07-23 ENCOUNTER — Ambulatory Visit (INDEPENDENT_AMBULATORY_CARE_PROVIDER_SITE_OTHER): Admitting: Bariatrics

## 2020-07-23 ENCOUNTER — Encounter (INDEPENDENT_AMBULATORY_CARE_PROVIDER_SITE_OTHER): Payer: Self-pay | Admitting: Bariatrics

## 2020-07-23 ENCOUNTER — Other Ambulatory Visit: Payer: Self-pay

## 2020-07-23 VITALS — BP 125/81 | HR 58 | Temp 97.5°F | Ht 73.0 in | Wt 241.0 lb

## 2020-07-23 DIAGNOSIS — E559 Vitamin D deficiency, unspecified: Secondary | ICD-10-CM | POA: Diagnosis not present

## 2020-07-23 DIAGNOSIS — E039 Hypothyroidism, unspecified: Secondary | ICD-10-CM | POA: Diagnosis not present

## 2020-07-23 DIAGNOSIS — K760 Fatty (change of) liver, not elsewhere classified: Secondary | ICD-10-CM

## 2020-07-23 DIAGNOSIS — E669 Obesity, unspecified: Secondary | ICD-10-CM

## 2020-07-23 DIAGNOSIS — R0602 Shortness of breath: Secondary | ICD-10-CM | POA: Diagnosis not present

## 2020-07-23 DIAGNOSIS — R5383 Other fatigue: Secondary | ICD-10-CM | POA: Diagnosis not present

## 2020-07-23 DIAGNOSIS — Z1331 Encounter for screening for depression: Secondary | ICD-10-CM

## 2020-07-23 DIAGNOSIS — Z9189 Other specified personal risk factors, not elsewhere classified: Secondary | ICD-10-CM | POA: Diagnosis not present

## 2020-07-23 DIAGNOSIS — Z6831 Body mass index (BMI) 31.0-31.9, adult: Secondary | ICD-10-CM

## 2020-07-23 DIAGNOSIS — K219 Gastro-esophageal reflux disease without esophagitis: Secondary | ICD-10-CM

## 2020-07-23 DIAGNOSIS — R7309 Other abnormal glucose: Secondary | ICD-10-CM

## 2020-07-23 DIAGNOSIS — Z0289 Encounter for other administrative examinations: Secondary | ICD-10-CM

## 2020-07-24 ENCOUNTER — Encounter (INDEPENDENT_AMBULATORY_CARE_PROVIDER_SITE_OTHER): Payer: Self-pay | Admitting: Bariatrics

## 2020-07-24 DIAGNOSIS — R7303 Prediabetes: Secondary | ICD-10-CM | POA: Insufficient documentation

## 2020-07-24 DIAGNOSIS — M722 Plantar fascial fibromatosis: Secondary | ICD-10-CM | POA: Insufficient documentation

## 2020-07-24 LAB — HEMOGLOBIN A1C
Est. average glucose Bld gHb Est-mCnc: 123 mg/dL
Hgb A1c MFr Bld: 5.9 % — ABNORMAL HIGH (ref 4.8–5.6)

## 2020-07-24 LAB — INSULIN, RANDOM: INSULIN: 17.7 u[IU]/mL (ref 2.6–24.9)

## 2020-07-24 LAB — VITAMIN D 25 HYDROXY (VIT D DEFICIENCY, FRACTURES): Vit D, 25-Hydroxy: 35.4 ng/mL (ref 30.0–100.0)

## 2020-07-25 ENCOUNTER — Encounter (INDEPENDENT_AMBULATORY_CARE_PROVIDER_SITE_OTHER): Payer: Self-pay | Admitting: Bariatrics

## 2020-07-25 NOTE — Progress Notes (Signed)
Chief Complaint:   OBESITY Phillip Gomez (MR# [id number]) is a 55 y.o. male who presents for evaluation and treatment of obesity and related comorbidities. Current BMI is Body mass index is 31.8 kg/m. Phillip Gomez has been struggling with his weight for many years and has been unsuccessful in either losing weight, maintaining weight loss, or reaching his healthy weight goal.  Phillip Gomez is currently in the action stage of change and ready to dedicate time achieving and maintaining a healthier weight. Phillip Gomez is interested in becoming our patient and working on intensive lifestyle modifications including (but not limited to) diet and exercise for weight loss.  Phillip Gomez likes to [employer].  He craves sweets.  Phillip Gomez's habits were reviewed today and are as follows: His family eats meals together, he thinks his family will eat healthier with him, his desired weight loss is 40+ pounds, he started gaining weight within the last 10 years, after retirement, his heaviest weight ever was his current weight, he craves sweets, he snacks frequently in the evenings, he is frequently drinking liquids with calories, he frequently makes poor food choices, he frequently eats larger portions than normal and he struggles with emotional eating.  Depression Screen Phillip Gomez's Food and Mood (modified PHQ-9) score was 9.  Depression screen PHQ 2/9 07/23/2020  Decreased Interest 1  Down, Depressed, Hopeless 1  PHQ - 2 Score 2  Altered sleeping 1  Tired, decreased energy 2  Change in appetite 1  Feeling bad or failure about yourself  0  Trouble concentrating 3  Moving slowly or fidgety/restless 0  Suicidal thoughts 0  PHQ-9 Score 9  Difficult doing work/chores Not difficult at all   Subjective:   1. Other fatigue Phillip Gomez admits to daytime somnolence and reports waking up still tired. Patent has a history of symptoms of daytime fatigue, morning fatigue and snoring. Phillip Gomez generally gets 6 hours of sleep per night, and states  that he has poor quality sleep. Snoring is present. Apneic episodes are not present. Epworth Sleepiness Score is 6.  Occurs with certain activities.   2. SOB (shortness of breath) on exertion Phillip Gomez notes increasing shortness of breath with exercising and seems to be worsening over time with weight gain. He notes getting out of breath sooner with activity than he used to. This has gotten worse recently. Phillip Gomez denies shortness of breath at rest or orthopnea.  Occurs with certain activities.  3. Fatty liver He had CT on 04/16/2020 that showed hepatic steatosis.    4. Acquired hypothyroidism He is taking levothyroxine 137 mcg daily.  Lab Results  Component Value Date   TSH 3.040 04/17/2020   5. Gastroesophageal reflux disease, unspecified whether esophagitis present He is taking omeprazole 20 mg daily.  6. Elevated glucose He is on no medications for this.  7. Vitamin D deficiency He is currently taking no vitamin D supplement.   8. Depression screen Phillip Gomez was screened for depression today as part of his new patient workup.  PHQ-9 is 9.  9. At risk for activity intolerance Phillip Gomez is at risk for activity intolerance due to obesity and fatigue.  Assessment/Plan:   1. Other fatigue Phillip Gomez does feel that his weight is causing his energy to be lower than it should be. Fatigue may be related to obesity, depression or many other causes. Labs will be ordered, and in the meanwhile, Phillip Gomez will focus on self care including making healthy food choices, increasing physical activity and focusing on stress reduction.  Gradually increase activities.  Check  EKG and labs today.  - EKG 12-Lead - Hemoglobin A1c - Insulin, random - VITAMIN D 25 Hydroxy (Vit-D Deficiency, Fractures)  2. SOB (shortness of breath) on exertion Phillip Gomez does feel that he gets out of breath more easily that he used to when he exercises. Phillip Gomez shortness of breath appears to be obesity related and exercise induced. He has  agreed to work on weight loss and gradually increase exercise to treat his exercise induced shortness of breath. Will continue to monitor closely.  Gradually increase activities.  Check IC today.  3. Fatty liver Work on diet and exercise.  4. Acquired hypothyroidism Patient with long-standing hypothyroidism, on levothyroxine therapy. He appears euthyroid. Orders and follow up as documented in patient record.  Continue medication.  Counseling . Good thyroid control is important for overall health. Supratherapeutic thyroid levels are dangerous and will not improve weight loss results. . The correct way to take levothyroxine is fasting, with water, separated by at least 30 minutes from breakfast, and separated by more than 4 hours from calcium, iron, multivitamins, acid reflux medications (PPIs).   5. Gastroesophageal reflux disease, unspecified whether esophagitis present Intensive lifestyle modifications are the first line treatment for this issue. We discussed several lifestyle modifications today and he will continue to work on diet, exercise and weight loss efforts. Orders and follow up as documented in patient record.  Continue omeprazole.  Counseling . If a person has gastroesophageal reflux disease (GERD), food and stomach acid move back up into the esophagus and cause symptoms or problems such as damage to the esophagus. . Anti-reflux measures include: raising the head of the bed, avoiding tight clothing or belts, avoiding eating late at night, not lying down shortly after mealtime, and achieving weight loss. . Avoid ASA, NSAID's, caffeine, alcohol, and tobacco.  . OTC Pepcid and/or Tums are often very helpful for as needed use.  Marland Kitchen However, for persisting chronic or daily symptoms, stronger medications like Omeprazole may be needed. . You may need to avoid foods and drinks such as: ? Coffee and tea (with or without caffeine). ? Drinks that contain alcohol. ? Energy drinks and sports  drinks. ? Bubbly (carbonated) drinks or sodas. ? Chocolate and cocoa. ? Peppermint and mint flavorings. ? Garlic and onions. ? Horseradish. ? Spicy and acidic foods. These include peppers, chili powder, curry powder, vinegar, hot sauces, and BBQ sauce. ? Citrus fruit juices and citrus fruits, such as oranges, lemons, and limes. ? Tomato-based foods. These include red sauce, chili, salsa, and pizza with red sauce. ? Fried and fatty foods. These include donuts, french fries, potato chips, and high-fat dressings. ? High-fat meats. These include hot dogs, rib eye steak, sausage, ham, and bacon.  6. Elevated glucose Check A1c and insulin today.  - Hemoglobin A1c - Insulin, random  7. Vitamin D deficiency Low Vitamin D level contributes to fatigue and are associated with obesity, breast, and colon cancer. Check vitamin D level today.  - VITAMIN D 25 Hydroxy (Vit-D Deficiency, Fractures)  8. Depression screen Phillip Gomez had a positive depression screening. Depression is commonly associated with obesity and often results in emotional eating behaviors. We will monitor this closely and work on CBT to help improve the non-hunger eating patterns. Referral to Psychology may be required if no improvement is seen as he continues in our clinic.  9. At risk for activity intolerance Phillip Gomez was given approximately 15 minutes of exercise intolerance counseling today. He is 55 y.o. male and has risk factors exercise intolerance  including obesity. We discussed intensive lifestyle modifications today with an emphasis on specific weight loss instructions and strategies. Phillip Gomez will slowly increase activity as tolerated.  Repetitive spaced learning was employed today to elicit superior memory formation and behavioral change.  10. Class 1 obesity with serious comorbidity and body mass index (BMI) of 31.0 to 31.9 in adult, unspecified obesity type  Phillip Gomez is currently in the action stage of change and his goal is  to continue with weight loss efforts. I recommend Phillip Gomez begin the structured treatment plan as follows:  He has agreed to the Category 3 Plan.  He will work on meal planning, eliminating sugar-sweetened beverages, and decreasing portion sizes.  Labs from 03/25/2020, including CMP, lipids, TSH, and glucose, and TSH from 04/17/2020, were reviewed today.  Exercise goals: No exercise has been prescribed at this time.   Behavioral modification strategies: increasing lean protein intake, decreasing simple carbohydrates, increasing vegetables, increasing water intake, decreasing eating out, no skipping meals, meal planning and cooking strategies, keeping healthy foods in the home and planning for success.  He was informed of the importance of frequent follow-up visits to maximize his success with intensive lifestyle modifications for his multiple health conditions. He was informed we would discuss his lab results at his next visit unless there is a critical issue that needs to be addressed sooner. Phillip Gomez agreed to keep his next visit at the agreed upon time to discuss these results.  Objective:   Blood pressure 125/81, pulse (!) 58, temperature (!) 97.5 F (36.4 C), height 6\' 1"  (1.854 m), weight 241 lb (109.3 kg), SpO2 95 %. Body mass index is 31.8 kg/m.  EKG: Normal sinus rhythm, rate 61 bpm.  Indirect Calorimeter completed today shows a VO2 of 282 and a REE of 1966.  His calculated basal metabolic rate is thus his basal metabolic rate is worse than expected.  General: Cooperative, alert, well developed, in no acute distress. HEENT: Conjunctivae and lids unremarkable. Cardiovascular: Regular rhythm.  Lungs: Normal work of breathing. Neurologic: No focal deficits.   Lab Results  Component Value Date   CREATININE 1.13 03/25/2020   BUN 21 03/25/2020   NA 141 03/25/2020   K 5.0 03/25/2020   CL 103 03/25/2020   CO2 27 03/25/2020   Lab Results  Component Value Date   ALT 24 03/25/2020    AST 21 03/25/2020   ALKPHOS 106 03/25/2020   BILITOT 0.2 03/25/2020   Lab Results  Component Value Date   HGBA1C 5.9 (H) 07/23/2020   Lab Results  Component Value Date   INSULIN 17.7 07/23/2020   Lab Results  Component Value Date   TSH 3.040 04/17/2020   Lab Results  Component Value Date   CHOL 149 03/25/2020   HDL 36 (L) 03/25/2020   LDLCALC 88 03/25/2020   LDLDIRECT 81.0 02/14/2015   TRIG 139 03/25/2020   CHOLHDL 4.1 03/25/2020   Lab Results  Component Value Date   WBC 7.2 09/30/2016   HGB 16.5 09/30/2016   HCT 46.6 09/30/2016   MCV 90.7 09/30/2016   PLT 190 09/30/2016   Attestation Statements:   Reviewed by clinician on day of visit: allergies, medications, problem list, medical history, surgical history, family history, social history, and previous encounter notes.  I, 10/02/2016, CMA, am acting as Insurance claims handler for Energy manager, DO  I have reviewed the above documentation for accuracy and completeness, and I agree with the above. [employer], DO

## 2020-08-01 ENCOUNTER — Other Ambulatory Visit: Payer: Self-pay | Admitting: Family Medicine

## 2020-08-01 NOTE — Telephone Encounter (Signed)
Name of Medication: Methylphenidate Name of Pharmacy: [facility] Last Fill or Written Date and Quantity: 07/04/20, #30 Last Office Visit and Type: 03/29/20, CPE Next Office Visit and Type: 04/02/21, CPE Last Controlled Substance Agreement Date: 05/24/17 Last UDS: 02/07/19

## 2020-08-04 MED ORDER — METHYLPHENIDATE HCL ER (OSM) 27 MG PO TBCR: 27.0000 mg | EXTENDED_RELEASE_TABLET | Freq: Every day | ORAL | 0 refills | Status: DC

## 2020-08-04 NOTE — Telephone Encounter (Signed)
ERx 

## 2020-08-06 ENCOUNTER — Ambulatory Visit (INDEPENDENT_AMBULATORY_CARE_PROVIDER_SITE_OTHER): Admitting: Bariatrics

## 2020-08-06 ENCOUNTER — Other Ambulatory Visit: Payer: Self-pay

## 2020-08-06 VITALS — BP 129/84 | HR 57 | Temp 98.0°F | Ht 73.0 in | Wt 233.0 lb

## 2020-08-06 DIAGNOSIS — Z9189 Other specified personal risk factors, not elsewhere classified: Secondary | ICD-10-CM

## 2020-08-06 DIAGNOSIS — Z6831 Body mass index (BMI) 31.0-31.9, adult: Secondary | ICD-10-CM | POA: Diagnosis not present

## 2020-08-06 DIAGNOSIS — E559 Vitamin D deficiency, unspecified: Secondary | ICD-10-CM

## 2020-08-06 DIAGNOSIS — E669 Obesity, unspecified: Secondary | ICD-10-CM | POA: Diagnosis not present

## 2020-08-06 DIAGNOSIS — R7303 Prediabetes: Secondary | ICD-10-CM

## 2020-08-06 MED ORDER — VITAMIN D (ERGOCALCIFEROL) 1.25 MG (50000 UNIT) PO CAPS: 50000.0000 [IU] | ORAL_CAPSULE | ORAL | 0 refills | Status: DC

## 2020-08-07 ENCOUNTER — Encounter (INDEPENDENT_AMBULATORY_CARE_PROVIDER_SITE_OTHER): Payer: Self-pay | Admitting: Bariatrics

## 2020-08-07 DIAGNOSIS — M279 Disease of jaws, unspecified: Secondary | ICD-10-CM | POA: Insufficient documentation

## 2020-08-07 DIAGNOSIS — H179 Unspecified corneal scar and opacity: Secondary | ICD-10-CM | POA: Insufficient documentation

## 2020-08-07 NOTE — Progress Notes (Signed)
Chief Complaint:   OBESITY Phillip Gomez is here to discuss his progress with his obesity treatment plan along with follow-up of his obesity related diagnoses. Phillip Gomez is on the Category 3 Plan and states he is following his eating plan approximately 98% of the time. Phillip Gomez states he is walking 15 minutes 4 times per week.  Today's visit was #: 2 Starting weight: 241 lbs Starting date: 07/23/2020 Today's weight: 233 lbs Today's date: 08/06/2020 Total lbs lost to date: 8 Total lbs lost since last in-office visit: 8  Interim History: Phillip Gomez is down 8 lbs, but states that he is already bored with the plan and wants substitutions.  Subjective:   1. Pre-diabetes Phillip Gomez's A1c is 5.9 and insulin level 17.7.  2. Vitamin D deficiency Phillip Gomez's Vit D level is 35.4. He is not on a Vit D supplement.  3. At risk for diabetes mellitus Phillip Gomez is at higher than average risk for developing diabetes due to obesity and pre-diabetes.  Assessment/Plan:   1. Pre-diabetes Phillip Gomez will continue to work on weight loss, exercise, and decreasing simple carbohydrates to help decrease the risk of diabetes. Increase healthy protein and fats. Handout: Pre-diabetes/Insulin Resistance  2. Vitamin D deficiency Low Vitamin D level contributes to fatigue and are associated with obesity, breast, and colon cancer. He agrees to start to take prescription Vitamin D @50 ,000 IU every week and will follow-up for routine testing of Vitamin D, at least 2-3 times per year to avoid over-replacement.  - Vitamin D, Ergocalciferol, (DRISDOL) 1.25 MG (50000 UNIT) CAPS capsule; Take 1 capsule (50,000 Units total) by mouth every 7 (seven) days.  Dispense: 12 capsule; Refill: 0  3. At risk for diabetes mellitus Phillip Gomez was given approximately 15 minutes of diabetes education and counseling today. We discussed intensive lifestyle modifications today with an emphasis on weight loss as well as increasing exercise and decreasing simple  carbohydrates in his diet. We also reviewed medication options with an emphasis on risk versus benefit of those discussed.   Repetitive spaced learning was employed today to elicit superior memory formation and behavioral change.  4. Obesity, current BMI 30 Phillip Gomez is currently in the action stage of change. As such, his goal is to continue with weight loss efforts. He has agreed to the Category 3 Plan.   Meal plan Will begin some juicing 07/23/2020 labs reviewed "[place]"  Exercise goals: As is  Behavioral modification strategies: increasing lean protein intake, decreasing simple carbohydrates, increasing vegetables, increasing water intake, decreasing eating out, no skipping meals, meal planning and cooking strategies, keeping healthy foods in the home, avoiding temptations and planning for success.  Phillip Gomez has agreed to follow-up with our clinic in 2 weeks with [facility] or [place]. He was informed of the importance of frequent follow-up visits to maximize his success with intensive lifestyle modifications for his multiple health conditions.   Objective:   Blood pressure 129/84, pulse (!) 57, temperature 98 F (36.7 C), height 6\' 1"  (1.854 m), weight 233 lb (105.7 kg), SpO2 96 %. Body mass index is 30.74 kg/m.  General: Cooperative, alert, well developed, in no acute distress. HEENT: Conjunctivae and lids unremarkable. Cardiovascular: Regular rhythm.  Lungs: Normal work of breathing. Neurologic: No focal deficits.   Lab Results  Component Value Date   CREATININE 1.13 03/25/2020   BUN 21 03/25/2020   NA 141 03/25/2020   K 5.0 03/25/2020   CL 103 03/25/2020   CO2 27 03/25/2020   Lab Results  Component Value Date  ALT 24 03/25/2020   AST 21 03/25/2020   ALKPHOS 106 03/25/2020   BILITOT 0.2 03/25/2020   Lab Results  Component Value Date   HGBA1C 5.9 (H) 07/23/2020   Lab Results  Component Value Date   INSULIN 17.7 07/23/2020   Lab Results  Component Value Date   TSH  3.040 04/17/2020   Lab Results  Component Value Date   CHOL 149 03/25/2020   HDL 36 (L) 03/25/2020   LDLCALC 88 03/25/2020   LDLDIRECT 81.0 02/14/2015   TRIG 139 03/25/2020   CHOLHDL 4.1 03/25/2020   Lab Results  Component Value Date   WBC 7.2 09/30/2016   HGB 16.5 09/30/2016   HCT 46.6 09/30/2016   MCV 90.7 09/30/2016   PLT 190 09/30/2016    Attestation Statements:   Reviewed by clinician on day of visit: allergies, medications, problem list, medical history, surgical history, family history, social history, and previous encounter notes.  Edmund Hilda, am acting as Energy manager for [employer], DO.  I have reviewed the above documentation for accuracy and completeness, and I agree with the above. Corinna Capra, DO

## 2020-08-21 ENCOUNTER — Other Ambulatory Visit: Payer: Self-pay

## 2020-08-21 ENCOUNTER — Ambulatory Visit (INDEPENDENT_AMBULATORY_CARE_PROVIDER_SITE_OTHER): Admitting: Adult Health

## 2020-08-21 ENCOUNTER — Encounter (INDEPENDENT_AMBULATORY_CARE_PROVIDER_SITE_OTHER): Payer: Self-pay | Admitting: Adult Health

## 2020-08-21 VITALS — BP 112/70 | HR 55 | Temp 98.1°F | Ht 73.0 in | Wt 227.0 lb

## 2020-08-21 DIAGNOSIS — E669 Obesity, unspecified: Secondary | ICD-10-CM | POA: Diagnosis not present

## 2020-08-21 DIAGNOSIS — E559 Vitamin D deficiency, unspecified: Secondary | ICD-10-CM

## 2020-08-21 DIAGNOSIS — F988 Other specified behavioral and emotional disorders with onset usually occurring in childhood and adolescence: Secondary | ICD-10-CM

## 2020-08-22 DIAGNOSIS — E559 Vitamin D deficiency, unspecified: Secondary | ICD-10-CM | POA: Insufficient documentation

## 2020-08-22 NOTE — Progress Notes (Signed)
Chief Complaint:   OBESITY Phillip Gomez is here to discuss his progress with his obesity treatment plan along with follow-up of his obesity related diagnoses. Phillip Gomez is on the Category 3 Plan and states he is following his eating plan approximately 100% of the time. Phillip Gomez states he is walking 15 minutes 3 times per week.  Today's visit was #: 3 Starting weight: 241 lbs Starting date: 07/23/2020 Today's weight: 227 lbs Today's date: 08/21/2020 Total lbs lost to date: 14 Total lbs lost since last in-office visit: 6  Interim History: Phillip Gomez has been very disciplined on category 3 meal plan- 100% compliance.  He is down another 6 lbs for a total weight loss, thus far, of 14 lbs! Current BMI 29.9. He has been increasing walking- 30 minutes 3 times a week.  Subjective:   1. Vitamin D deficiency Phillip Gomez's Vitamin D level was 35.4 on 07/23/2020, which is well below goal of 50. He is currently taking prescription vitamin D 50,000 IU each week. He denies nausea, vomiting or muscle weakness.  2. Attention deficit disorder, unspecified hyperactivity presence Phillip Gomez has been well controlled on Concerta- various doses throughout the years. He is currently on Concerta 27 mg QD.  He reports good focus/concentraton on Concerta. PDMP reviewed- no aberrancies noted.  Assessment/Plan:   1. Vitamin D deficiency Low Vitamin D level contributes to fatigue and are associated with obesity, breast, and colon cancer. He agrees to continue to take prescription Vitamin D @50 ,000 IU every week and will follow-up for routine testing of Vitamin D, at least 2-3 times per year to avoid over-replacement. Check labs every 3 months.  2. Attention deficit disorder, unspecified hyperactivity presence Continue Concerta and regular exercise.  3. Obesity with current BMI 29.9  Phillip Gomez is currently in the action stage of change. As such, his goal is to continue with weight loss efforts. He has agreed to the Category 3  Plan.   Exercise goals: As is  Behavioral modification strategies: increasing lean protein intake, decreasing simple carbohydrates, meal planning and cooking strategies and planning for success.  Phillip Gomez has agreed to follow-up with our clinic in 2 weeks. He was informed of the importance of frequent follow-up visits to maximize his success with intensive lifestyle modifications for his multiple health conditions.   Objective:   Blood pressure 112/70, pulse (!) 55, temperature 98.1 F (36.7 C), height 6\' 1"  (1.854 m), weight 227 lb (103 kg), SpO2 96 %. Body mass index is 29.95 kg/m.  General: Cooperative, alert, well developed, in no acute distress. HEENT: Conjunctivae and lids unremarkable. Cardiovascular: Regular rhythm.  Lungs: Normal work of breathing. Neurologic: No focal deficits.   Lab Results  Component Value Date   CREATININE 1.13 03/25/2020   BUN 21 03/25/2020   NA 141 03/25/2020   K 5.0 03/25/2020   CL 103 03/25/2020   CO2 27 03/25/2020   Lab Results  Component Value Date   ALT 24 03/25/2020   AST 21 03/25/2020   ALKPHOS 106 03/25/2020   BILITOT 0.2 03/25/2020   Lab Results  Component Value Date   HGBA1C 5.9 (H) 07/23/2020   Lab Results  Component Value Date   INSULIN 17.7 07/23/2020   Lab Results  Component Value Date   TSH 3.040 04/17/2020   Lab Results  Component Value Date   CHOL 149 03/25/2020   HDL 36 (L) 03/25/2020   LDLCALC 88 03/25/2020   LDLDIRECT 81.0 02/14/2015   TRIG 139 03/25/2020   CHOLHDL 4.1 03/25/2020  Lab Results  Component Value Date   WBC 7.2 09/30/2016   HGB 16.5 09/30/2016   HCT 46.6 09/30/2016   MCV 90.7 09/30/2016   PLT 190 09/30/2016   No results found for: IRON, TIBC, FERRITIN   Attestation Statements:   Reviewed by clinician on day of visit: allergies, medications, problem list, medical history, surgical history, family history, social history, and previous encounter notes.  Time spent on visit including  pre-visit chart review and post-visit care and charting was 25 minutes.   Edmund Hilda, CMA, am acting as transcriptionist for William Hamburger, NP.  I have reviewed the above documentation for accuracy and completeness, and I agree with the above. -  Phillip Neuser d. Lenise Jr, NP-C

## 2020-09-02 ENCOUNTER — Other Ambulatory Visit: Payer: Self-pay | Admitting: Family Medicine

## 2020-09-02 NOTE — Telephone Encounter (Signed)
Name of Medication: Methylphenidate Name of Pharmacy: [facility] Last Fill or Written Date and Quantity: 08/04/20, #30 Last Office Visit and Type: 03/29/20, CPE Next Office Visit and Type: 04/02/21, CPE Last Controlled Substance Agreement Date: 05/24/17 Last UDS: 02/07/19

## 2020-09-03 MED ORDER — METHYLPHENIDATE HCL ER (OSM) 27 MG PO TBCR: 27.0000 mg | EXTENDED_RELEASE_TABLET | Freq: Every day | ORAL | 0 refills | Status: DC

## 2020-09-03 NOTE — Telephone Encounter (Signed)
ERx 

## 2020-09-05 ENCOUNTER — Ambulatory Visit (INDEPENDENT_AMBULATORY_CARE_PROVIDER_SITE_OTHER): Admitting: Bariatrics

## 2020-09-10 ENCOUNTER — Ambulatory Visit (INDEPENDENT_AMBULATORY_CARE_PROVIDER_SITE_OTHER): Admitting: Bariatrics

## 2020-09-10 ENCOUNTER — Other Ambulatory Visit: Payer: Self-pay

## 2020-09-10 ENCOUNTER — Encounter (INDEPENDENT_AMBULATORY_CARE_PROVIDER_SITE_OTHER): Payer: Self-pay | Admitting: Bariatrics

## 2020-09-10 VITALS — BP 115/76 | HR 60 | Temp 98.0°F | Ht 73.0 in | Wt 219.0 lb

## 2020-09-10 DIAGNOSIS — E559 Vitamin D deficiency, unspecified: Secondary | ICD-10-CM

## 2020-09-10 DIAGNOSIS — Z6831 Body mass index (BMI) 31.0-31.9, adult: Secondary | ICD-10-CM

## 2020-09-10 DIAGNOSIS — E038 Other specified hypothyroidism: Secondary | ICD-10-CM | POA: Diagnosis not present

## 2020-09-10 DIAGNOSIS — E669 Obesity, unspecified: Secondary | ICD-10-CM

## 2020-09-16 NOTE — Progress Notes (Signed)
Chief Complaint:   OBESITY Phillip Gomez is here to discuss his progress with his obesity treatment plan along with follow-up of his obesity related diagnoses. Phillip Gomez is on the Category 3 Plan and states he is following his eating plan approximately 95% of the time. Phillip Gomez states he is walking for 15 minutes 3 times per week.  Today's visit was #: 4 Starting weight: 241 lbs Starting date: 07/23/2020 Today's weight: 219 lbs Today's date: 09/10/2020 Total lbs lost to date: 22 Total lbs lost since last in-office visit: 8  Interim History: Dick is down an additional 8 lbs and he is doing well overall. He has more energy and he is feeling good about the program.  Subjective:   1. Vitamin D deficiency Phillip Gomez is taking Vit D, and he denies nausea, vomiting, or muscle weakness.  2. Other specified hypothyroidism Phillip Gomez is taking Synthroid, and he denies fatigue.  Assessment/Plan:   1. Vitamin D deficiency Low Vitamin D level contributes to fatigue and are associated with obesity, breast, and colon cancer. Phillip Gomez agreed to continue taking prescription Vitamin D 50,000 IU every week and will follow-up for routine testing of Vitamin D, at least 2-3 times per year to avoid over-replacement.  2. Other specified hypothyroidism Phillip Gomez will continue Synthroid, and he will continue to follow up as directed. Orders and follow up as documented in patient record.  Counseling . Good thyroid control is important for overall health. Supratherapeutic thyroid levels are dangerous and will not improve weight loss results. . Counseling: The correct way to take levothyroxine is fasting, with water, separated by at least 30 minutes from breakfast, and separated by more than 4 hours from calcium, iron, multivitamins, acid reflux medications (PPIs).   3. Obesity, current BMI 28 Phillip Gomez is currently in the action stage of change. As such, his goal is to continue with weight loss efforts. He has agreed to the  Category 3 Plan.   Intentional eating was discussed. Phillip Gomez will do more grilling, and Recipes II handout was given.  Exercise goals: As is, and start resistance.  Behavioral modification strategies: increasing lean protein intake, decreasing simple carbohydrates, increasing vegetables, increasing water intake, decreasing eating out, no skipping meals, meal planning and cooking strategies, keeping healthy foods in the home and planning for success.  Phillip Gomez has agreed to follow-up with our clinic in 2 weeks with William Hamburger, NP or with myself. He was informed of the importance of frequent follow-up visits to maximize his success with intensive lifestyle modifications for his multiple health conditions.   Objective:   Blood pressure 115/76, pulse 60, temperature 98 F (36.7 C), height 6\' 1"  (1.854 m), weight 219 lb (99.3 kg), SpO2 95 %. Body mass index is 28.89 kg/m.  General: Cooperative, alert, well developed, in no acute distress. HEENT: Conjunctivae and lids unremarkable. Cardiovascular: Regular rhythm.  Lungs: Normal work of breathing. Neurologic: No focal deficits.   Lab Results  Component Value Date   CREATININE 1.13 03/25/2020   BUN 21 03/25/2020   NA 141 03/25/2020   K 5.0 03/25/2020   CL 103 03/25/2020   CO2 27 03/25/2020   Lab Results  Component Value Date   ALT 24 03/25/2020   AST 21 03/25/2020   ALKPHOS 106 03/25/2020   BILITOT 0.2 03/25/2020   Lab Results  Component Value Date   HGBA1C 5.9 (H) 07/23/2020   Lab Results  Component Value Date   INSULIN 17.7 07/23/2020   Lab Results  Component Value Date  TSH 3.040 04/17/2020   Lab Results  Component Value Date   CHOL 149 03/25/2020   HDL 36 (L) 03/25/2020   LDLCALC 88 03/25/2020   LDLDIRECT 81.0 02/14/2015   TRIG 139 03/25/2020   CHOLHDL 4.1 03/25/2020   Lab Results  Component Value Date   WBC 7.2 09/30/2016   HGB 16.5 09/30/2016   HCT 46.6 09/30/2016   MCV 90.7 09/30/2016   PLT 190  09/30/2016   No results found for: IRON, TIBC, FERRITIN  Attestation Statements:   Reviewed by clinician on day of visit: allergies, medications, problem list, medical history, surgical history, family history, social history, and previous encounter notes.   Trude Mcburney, am acting as Energy manager for [employer], DO.  I have reviewed the above documentation for accuracy and completeness, and I agree with the above. Corinna Capra, DO

## 2020-09-17 ENCOUNTER — Encounter (INDEPENDENT_AMBULATORY_CARE_PROVIDER_SITE_OTHER): Payer: Self-pay | Admitting: Bariatrics

## 2020-10-02 ENCOUNTER — Other Ambulatory Visit: Payer: Self-pay | Admitting: Family Medicine

## 2020-10-02 ENCOUNTER — Ambulatory Visit (INDEPENDENT_AMBULATORY_CARE_PROVIDER_SITE_OTHER): Admitting: Adult Health

## 2020-10-03 ENCOUNTER — Ambulatory Visit (INDEPENDENT_AMBULATORY_CARE_PROVIDER_SITE_OTHER): Admitting: Adult Health

## 2020-10-03 ENCOUNTER — Encounter (INDEPENDENT_AMBULATORY_CARE_PROVIDER_SITE_OTHER): Payer: Self-pay | Admitting: Adult Health

## 2020-10-03 ENCOUNTER — Other Ambulatory Visit: Payer: Self-pay

## 2020-10-03 VITALS — BP 112/63 | HR 58 | Temp 98.4°F | Ht 73.0 in | Wt 215.0 lb

## 2020-10-03 DIAGNOSIS — E669 Obesity, unspecified: Secondary | ICD-10-CM

## 2020-10-03 DIAGNOSIS — Z1331 Encounter for screening for depression: Secondary | ICD-10-CM

## 2020-10-03 DIAGNOSIS — F339 Major depressive disorder, recurrent, unspecified: Secondary | ICD-10-CM | POA: Diagnosis not present

## 2020-10-03 DIAGNOSIS — R7303 Prediabetes: Secondary | ICD-10-CM

## 2020-10-03 MED ORDER — METHYLPHENIDATE HCL ER (OSM) 27 MG PO TBCR: 27.0000 mg | EXTENDED_RELEASE_TABLET | Freq: Every day | ORAL | 0 refills | Status: DC

## 2020-10-03 NOTE — Telephone Encounter (Signed)
ERx 

## 2020-10-03 NOTE — Telephone Encounter (Signed)
Name of Medication: Methylphenidate Name of Pharmacy: [facility] Last Fill or Written Date and Quantity: 09/03/20, #30 Last Office Visit and Type: 03/29/20, CPE Next Office Visit and Type: 04/02/21, CPE Last Controlled Substance Agreement Date: 05/24/17 Last UDS: 02/07/19

## 2020-10-07 ENCOUNTER — Encounter (INDEPENDENT_AMBULATORY_CARE_PROVIDER_SITE_OTHER): Payer: Self-pay

## 2020-10-07 NOTE — Progress Notes (Signed)
Chief Complaint:   OBESITY Phillip Gomez is here to discuss his progress with his obesity treatment plan along with follow-up of his obesity related diagnoses. Phillip Gomez is on the Category 3 Plan and states he is following his eating plan approximately 80-85% of the time. Phillip Gomez states he is walking 15-20 minutes 3 times per week.  Today's visit was #: 5 Starting weight: 241 lbs Starting date: 07/23/2020 Today's weight: 215 lbs Today's date: 10/03/2020 Total lbs lost to date: 26 Total lbs lost since last in-office visit: 4  Interim History: Phillip Gomez and his wife will be traveling for 2-3 weeks to [place] and [place]. He relaxed eating plan while enjoying a few meals at [facility], however utilized [facility]. He is down another 4 lbs for a total of 26 lbs!  Subjective:   1. Pre-diabetes Phillip Gomez's 07/23/2020 A1c was 5.9 and insulin level 17.7.  Lab Results  Component Value Date   HGBA1C 5.9 (H) 07/23/2020   Lab Results  Component Value Date   INSULIN 17.7 07/23/2020   2. Depression screen Phillip Gomez reports stable mood. Pt denies suicidal or homicidal ideations. He reports long term hx of depression. He is on fluoxetine 30 mg QD.  Assessment/Plan:   1. Pre-diabetes Phillip Gomez will continue to work on weight loss, exercise, and decreasing simple carbohydrates to help decrease the risk of diabetes.  Continue healthy eating and regular exercise.  2. Depression screen Behavior modification techniques were discussed today to help Phillip Gomez deal with his emotional/non-hunger eating behaviors.  Orders and follow up as documented in patient record.  Continue SSRI and regular walking.  3. Obesity with current BMI 28.4  Phillip Gomez is currently in the action stage of change. As such, his goal is to continue with weight loss efforts. He has agreed to the Category 3 Plan when home and keeping a food journal and adhering to recommended goals of 1500 calories and 85-100 g protein when traveling.   Exercise goals:  As  is  Behavioral modification strategies: increasing lean protein intake, decreasing simple carbohydrates, meal planning and cooking strategies, keeping healthy foods in the home, travel eating strategies, and planning for success.  Phillip Gomez has agreed to follow-up with our clinic in 4 weeks. He was informed of the importance of frequent follow-up visits to maximize his success with intensive lifestyle modifications for his multiple health conditions.   Objective:   Blood pressure 112/63, pulse (!) 58, temperature 98.4 F (36.9 C), height 6\' 1"  (1.854 m), weight 215 lb (97.5 kg), SpO2 97 %. Body mass index is 28.37 kg/m.  General: Cooperative, alert, well developed, in no acute distress. HEENT: Conjunctivae and lids unremarkable. Cardiovascular: Regular rhythm.  Lungs: Normal work of breathing. Neurologic: No focal deficits.   Lab Results  Component Value Date   CREATININE 1.13 03/25/2020   BUN 21 03/25/2020   NA 141 03/25/2020   K 5.0 03/25/2020   CL 103 03/25/2020   CO2 27 03/25/2020   Lab Results  Component Value Date   ALT 24 03/25/2020   AST 21 03/25/2020   ALKPHOS 106 03/25/2020   BILITOT 0.2 03/25/2020   Lab Results  Component Value Date   HGBA1C 5.9 (H) 07/23/2020   Lab Results  Component Value Date   INSULIN 17.7 07/23/2020   Lab Results  Component Value Date   TSH 3.040 04/17/2020   Lab Results  Component Value Date   CHOL 149 03/25/2020   HDL 36 (L) 03/25/2020   LDLCALC 88 03/25/2020   LDLDIRECT 81.0  02/14/2015   TRIG 139 03/25/2020   CHOLHDL 4.1 03/25/2020   Lab Results  Component Value Date   WBC 7.2 09/30/2016   HGB 16.5 09/30/2016   HCT 46.6 09/30/2016   MCV 90.7 09/30/2016   PLT 190 09/30/2016   No results found for: IRON, TIBC, FERRITIN   Attestation Statements:   Reviewed by clinician on day of visit: allergies, medications, problem list, medical history, surgical history, family history, social history, and previous encounter  notes.  Time spent on visit including pre-visit chart review and post-visit care and charting was 30 minutes.   Phillip Gomez, CMA, am acting as transcriptionist for Phillip Hamburger, NP.  I have reviewed the above documentation for accuracy and completeness, and I agree with the above. -  Phillip Gomez d. Liona Wengert, NP-C

## 2020-10-08 DIAGNOSIS — F330 Major depressive disorder, recurrent, mild: Secondary | ICD-10-CM | POA: Insufficient documentation

## 2020-10-08 DIAGNOSIS — F339 Major depressive disorder, recurrent, unspecified: Secondary | ICD-10-CM | POA: Insufficient documentation

## 2020-11-03 ENCOUNTER — Other Ambulatory Visit: Payer: Self-pay | Admitting: Family Medicine

## 2020-11-05 MED ORDER — METHYLPHENIDATE HCL ER (OSM) 27 MG PO TBCR: 27.0000 mg | EXTENDED_RELEASE_TABLET | Freq: Every day | ORAL | 0 refills | Status: DC

## 2020-11-05 NOTE — Telephone Encounter (Signed)
ERx 

## 2020-11-05 NOTE — Telephone Encounter (Signed)
Last OV - 03/29/2020 Next OV - 04/02/2021 Last Filled - 10/03/2020

## 2020-11-06 ENCOUNTER — Ambulatory Visit (INDEPENDENT_AMBULATORY_CARE_PROVIDER_SITE_OTHER): Admitting: Physician Assistant

## 2020-11-13 ENCOUNTER — Ambulatory Visit (INDEPENDENT_AMBULATORY_CARE_PROVIDER_SITE_OTHER): Admitting: Physician Assistant

## 2020-11-21 ENCOUNTER — Ambulatory Visit (INDEPENDENT_AMBULATORY_CARE_PROVIDER_SITE_OTHER): Admitting: Adult Health

## 2020-11-21 ENCOUNTER — Encounter (INDEPENDENT_AMBULATORY_CARE_PROVIDER_SITE_OTHER): Payer: Self-pay | Admitting: Adult Health

## 2020-11-21 ENCOUNTER — Other Ambulatory Visit: Payer: Self-pay

## 2020-11-21 VITALS — BP 117/69 | HR 60 | Temp 98.4°F | Ht 73.0 in | Wt 211.0 lb

## 2020-11-21 DIAGNOSIS — E669 Obesity, unspecified: Secondary | ICD-10-CM | POA: Diagnosis not present

## 2020-11-21 DIAGNOSIS — R7303 Prediabetes: Secondary | ICD-10-CM | POA: Diagnosis not present

## 2020-11-22 NOTE — Progress Notes (Signed)
Chief Complaint:   OBESITY Phillip Gomez is here to discuss his progress with his obesity treatment plan along with follow-up of his obesity related diagnoses. Phillip Gomez is on the Category 3 Plan  following 1500 calories with 85 g -100 g of protein while traveling) and states he is following his eating plan approximately 60% of the time. Phillip Gomez is currently not exercising.  Today's visit was #: 6 Starting weight: 241 lbs Starting date: 07/23/2020 Today's weight: 211 lbs Today's date: 11/21/2020 Total lbs lost to date: 30 lbs Total lbs lost since last in-office visit: 4 lbs  Interim History: Phillip Gomez recently had Covid-19 infection mid July 2022. He experienced significant symptoms for 2 days. He denies current symptoms. Phillip Gomez has lost 12.44 % of his total body weight. Reviewed Body Composition results with pt.  Subjective:   1. Prediabetes Phillip Gomez's HgA1c is at 5.9. He is not currently taking any blood glucose lowering prescriptions. He continues to work on diet and exercise to decrease his risk of diabetes. He denies nausea or hypoglycemia. He denies a family history of Diabetes type 2.  Lab Results  Component Value Date   HGBA1C 5.9 (H) 07/23/2020   Lab Results  Component Value Date   INSULIN 17.7 07/23/2020    Assessment/Plan:   1. Prediabetes Good blood sugar control is important to decrease the likelihood of diabetic complications such as nephropathy, neuropathy, limb loss, blindness, coronary artery disease, and death. Intensive lifestyle modification including diet, exercise and weight loss are the first line of treatment for diabetes. Phillip Gomez will continue with Category 3 meal plan.  2. Obesity with current BMI 27.9 Phillip Gomez is currently in the action stage of change. As such, his goal is to continue with weight loss efforts. He has agreed to the Category 3 Plan.  Fasting labs to be drawn at next office visit.  Exercise goals: As is.  Behavioral modification strategies:  increasing lean protein intake, decreasing simple carbohydrates, meal planning and cooking strategies, keeping healthy foods in the home, and planning for success.  Phillip Gomez has agreed to follow-up with our clinic in 3 weeks. He was informed of the importance of frequent follow-up visits to maximize his success with intensive lifestyle modifications for his multiple health conditions.   Objective:   Blood pressure 117/69, pulse 60, temperature 98.4 F (36.9 C), height 6\' 1"  (1.854 m), weight 211 lb (95.7 kg), SpO2 96 %. Body mass index is 27.84 kg/m.  General: Cooperative, alert, well developed, in no acute distress. HEENT: Conjunctivae and lids unremarkable. Cardiovascular: Regular rhythm.  Lungs: Normal work of breathing. Neurologic: No focal deficits.   Lab Results  Component Value Date   CREATININE 1.13 03/25/2020   BUN 21 03/25/2020   NA 141 03/25/2020   K 5.0 03/25/2020   CL 103 03/25/2020   CO2 27 03/25/2020   Lab Results  Component Value Date   ALT 24 03/25/2020   AST 21 03/25/2020   ALKPHOS 106 03/25/2020   BILITOT 0.2 03/25/2020   Lab Results  Component Value Date   HGBA1C 5.9 (H) 07/23/2020   Lab Results  Component Value Date   INSULIN 17.7 07/23/2020   Lab Results  Component Value Date   TSH 3.040 04/17/2020   Lab Results  Component Value Date   CHOL 149 03/25/2020   HDL 36 (L) 03/25/2020   LDLCALC 88 03/25/2020   LDLDIRECT 81.0 02/14/2015   TRIG 139 03/25/2020   CHOLHDL 4.1 03/25/2020   Lab Results  Component Value Date  VD25OH 35.4 07/23/2020   VD25OH 38.10 02/14/2015   Lab Results  Component Value Date   WBC 7.2 09/30/2016   HGB 16.5 09/30/2016   HCT 46.6 09/30/2016   MCV 90.7 09/30/2016   PLT 190 09/30/2016   No results found for: IRON, TIBC, FERRITIN  Attestation Statements:   Reviewed by clinician on day of visit: allergies, medications, problem list, medical history, surgical history, family history, social history, and previous  encounter notes.  Time spent on visit including pre-visit chart review and post-visit care and charting was 28 minutes.   IPaulla Fore, CMA, am acting as transcriptionist for William Hamburger, NP-C  I have reviewed the above documentation for accuracy and completeness, and I agree with the above. -  Jerl Munyan d. Wanya Bangura, NP-C

## 2020-12-02 ENCOUNTER — Other Ambulatory Visit: Payer: Self-pay | Admitting: Family Medicine

## 2020-12-03 MED ORDER — METHYLPHENIDATE HCL ER (OSM) 27 MG PO TBCR: 27.0000 mg | EXTENDED_RELEASE_TABLET | Freq: Every day | ORAL | 0 refills | Status: DC

## 2020-12-03 NOTE — Telephone Encounter (Signed)
Last filled 11-05-20 #30 Last OV - 03/29/2020 Next OV - 04/02/2021 [facility]

## 2020-12-03 NOTE — Telephone Encounter (Signed)
ERx 

## 2020-12-11 ENCOUNTER — Other Ambulatory Visit: Payer: Self-pay

## 2020-12-11 ENCOUNTER — Ambulatory Visit (INDEPENDENT_AMBULATORY_CARE_PROVIDER_SITE_OTHER): Admitting: Adult Health

## 2020-12-11 ENCOUNTER — Encounter (INDEPENDENT_AMBULATORY_CARE_PROVIDER_SITE_OTHER): Payer: Self-pay | Admitting: Adult Health

## 2020-12-11 VITALS — BP 113/73 | HR 67 | Temp 98.3°F | Ht 73.0 in | Wt 209.0 lb

## 2020-12-11 DIAGNOSIS — K760 Fatty (change of) liver, not elsewhere classified: Secondary | ICD-10-CM

## 2020-12-11 DIAGNOSIS — R7303 Prediabetes: Secondary | ICD-10-CM

## 2020-12-11 DIAGNOSIS — E669 Obesity, unspecified: Secondary | ICD-10-CM

## 2020-12-11 DIAGNOSIS — Z9189 Other specified personal risk factors, not elsewhere classified: Secondary | ICD-10-CM | POA: Diagnosis not present

## 2020-12-11 DIAGNOSIS — E559 Vitamin D deficiency, unspecified: Secondary | ICD-10-CM | POA: Diagnosis not present

## 2020-12-11 DIAGNOSIS — E66811 Obesity, class 1: Secondary | ICD-10-CM

## 2020-12-12 ENCOUNTER — Encounter (INDEPENDENT_AMBULATORY_CARE_PROVIDER_SITE_OTHER): Payer: Self-pay | Admitting: Adult Health

## 2020-12-12 LAB — INSULIN, RANDOM: INSULIN: 6.1 u[IU]/mL (ref 2.6–24.9)

## 2020-12-12 LAB — COMPREHENSIVE METABOLIC PANEL
ALT: 20 IU/L (ref 0–44)
AST: 24 IU/L (ref 0–40)
Albumin/Globulin Ratio: 1.5 (ref 1.2–2.2)
Albumin: 4.6 g/dL (ref 3.8–4.9)
Alkaline Phosphatase: 103 IU/L (ref 44–121)
BUN/Creatinine Ratio: 25 — ABNORMAL HIGH (ref 9–20)
BUN: 27 mg/dL — ABNORMAL HIGH (ref 6–24)
Bilirubin Total: 0.6 mg/dL (ref 0.0–1.2)
CO2: 25 mmol/L (ref 20–29)
Calcium: 9.7 mg/dL (ref 8.7–10.2)
Chloride: 103 mmol/L (ref 96–106)
Creatinine, Ser: 1.09 mg/dL (ref 0.76–1.27)
Globulin, Total: 3 g/dL (ref 1.5–4.5)
Glucose: 75 mg/dL (ref 65–99)
Potassium: 4.8 mmol/L (ref 3.5–5.2)
Sodium: 142 mmol/L (ref 134–144)
Total Protein: 7.6 g/dL (ref 6.0–8.5)
eGFR: 81 mL/min/{1.73_m2} (ref >59)

## 2020-12-12 LAB — VITAMIN D 25 HYDROXY (VIT D DEFICIENCY, FRACTURES): Vit D, 25-Hydroxy: 66.9 ng/mL (ref 30.0–100.0)

## 2020-12-12 LAB — HEMOGLOBIN A1C
Est. average glucose Bld gHb Est-mCnc: 114 mg/dL
Hgb A1c MFr Bld: 5.6 % (ref 4.8–5.6)

## 2020-12-12 NOTE — Progress Notes (Signed)
Chief Complaint:   OBESITY Phillip Gomez is here to discuss his progress with his obesity treatment plan along with follow-up of his obesity related diagnoses. Phillip Gomez is on the Category 3 Plan and states he is following his eating plan approximately 50% of the time. Phillip Gomez states he is walking for 15-20 minutes 3 times per week.  Today's visit was #: 7 Starting weight: 241 lbs Starting date: 07/23/2020 Today's weight: 209 lbs Today's date: 12/11/2020 Total lbs lost to date: 32 lbs Total lbs lost since last in-office visit: 2 lbs  Interim History: Phillip Gomez denies polyphagia.  He will save snack calories for his evening treat (i.e. pudding, yogurt).  Reviewed body composition with patient.  Subjective:   1. Vitamin D deficiency Vitamin D level was 35.4 on 07/23/2020.  He is currently taking prescription ergocalciferol 50,000 IU each week. He denies nausea, vomiting or muscle weakness.  Lab Results  Component Value Date   VD25OH 66.9 12/11/2020   VD25OH 35.4 07/23/2020   VD25OH 38.10 02/14/2015   2. Prediabetes He denies polyphagia.  Lab Results  Component Value Date   HGBA1C 5.6 12/11/2020   Lab Results  Component Value Date   INSULIN 6.1 12/11/2020   INSULIN 17.7 07/23/2020   3. NAFLD (nonalcoholic fatty liver disease) Noted on CT in 2019 - hepatic steatosis.  4. At risk for osteoporosis Phillip Gomez is at higher risk of osteopenia and osteoporosis due to Vitamin D deficiency and obesity.    Assessment/Plan:   1. Vitamin D deficiency Check labs today.  Refill ergocalciferol after labs.  - VITAMIN D 25 Hydroxy (Vit-D Deficiency, Fractures)  2. Prediabetes Check labs today.  - Hemoglobin A1c - Insulin, random  3. NAFLD (nonalcoholic fatty liver disease) Check labs today.  - Comprehensive metabolic panel  4. At risk for osteoporosis We will continue to monitor. Orders and follow up as documented in patient record.  Counseling Osteoporosis happens when your bones  get thin and weak. This can cause your bones to break (fracture) more easily.  Exercise is very important to keep bones strong. Focus on strength training (lifting weights) and exercises that make your muscles work to hold your body weight up (weight-bearing exercises). These include tai chi, yoga, and walking.  Limit alcohol intake to no more than 1 drink a day for nonpregnant women and 2 drinks a day for men. One drink equals 12 oz of beer, 5 oz of wine, or 1 oz of hard liquor. Do not use any products that have nicotine or tobacco in them.  Preventing falls Use tools to help you move around (mobility aids) as needed. These include canes, walkers, scooters, and crutches. Keep rooms well-lit and free of clutter. Wear shoes that fit you well and support your feet. Eat plenty of calcium and Vitamin D as these nutrients are good for your bones.    5. Obesity with current BMI 27.6  Phillip Gomez is currently in the action stage of change. As such, his goal is to continue with weight loss efforts. He has agreed to the Category 3 Plan and keeping a food journal and adhering to recommended goals of 450-600 calories and 40 protein.   Exercise goals:  As is.  Behavioral modification strategies: increasing lean protein intake, decreasing simple carbohydrates, meal planning and cooking strategies, keeping healthy foods in the home, and planning for success.  Phillip Gomez has agreed to follow-up with our clinic in 2 weeks. He was informed of the importance of frequent follow-up visits to maximize  his success with intensive lifestyle modifications for his multiple health conditions.   Phillip Gomez was informed we would discuss his lab results at his next visit unless there is a critical issue that needs to be addressed sooner. Phillip Gomez agreed to keep his next visit at the agreed upon time to discuss these results.  Objective:   Blood pressure 113/73, pulse 67, temperature 98.3 F (36.8 C), height 6' 1"  (1.854 m), weight 209  lb (94.8 kg), SpO2 99 %. Body mass index is 27.57 kg/m.  General: Cooperative, alert, well developed, in no acute distress. HEENT: Conjunctivae and lids unremarkable. Cardiovascular: Regular rhythm.  Lungs: Normal work of breathing. Neurologic: No focal deficits.   Lab Results  Component Value Date   CREATININE 1.09 12/11/2020   BUN 27 (H) 12/11/2020   NA 142 12/11/2020   K 4.8 12/11/2020   CL 103 12/11/2020   CO2 25 12/11/2020   Lab Results  Component Value Date   ALT 20 12/11/2020   AST 24 12/11/2020   ALKPHOS 103 12/11/2020   BILITOT 0.6 12/11/2020   Lab Results  Component Value Date   HGBA1C 5.6 12/11/2020   HGBA1C 5.9 (H) 07/23/2020   Lab Results  Component Value Date   INSULIN 6.1 12/11/2020   INSULIN 17.7 07/23/2020   Lab Results  Component Value Date   TSH 3.040 04/17/2020   Lab Results  Component Value Date   CHOL 149 03/25/2020   HDL 36 (L) 03/25/2020   LDLCALC 88 03/25/2020   LDLDIRECT 81.0 02/14/2015   TRIG 139 03/25/2020   CHOLHDL 4.1 03/25/2020   Lab Results  Component Value Date   VD25OH 66.9 12/11/2020   VD25OH 35.4 07/23/2020   VD25OH 38.10 02/14/2015   Lab Results  Component Value Date   WBC 7.2 09/30/2016   HGB 16.5 09/30/2016   HCT 46.6 09/30/2016   MCV 90.7 09/30/2016   PLT 190 09/30/2016   Attestation Statements:   Reviewed by clinician on day of visit: allergies, medications, problem list, medical history, surgical history, family history, social history, and previous encounter notes.  I, Water quality scientist, CMA, am acting as Location manager for Mina Marble, NP.  I have reviewed the above documentation for accuracy and completeness, and I agree with the above. -  Zala Degrasse d. Briley Bumgarner, NP-C

## 2020-12-12 NOTE — Telephone Encounter (Signed)
Pt response.

## 2021-01-01 ENCOUNTER — Other Ambulatory Visit: Payer: Self-pay

## 2021-01-01 ENCOUNTER — Encounter (INDEPENDENT_AMBULATORY_CARE_PROVIDER_SITE_OTHER): Payer: Self-pay | Admitting: Adult Health

## 2021-01-01 ENCOUNTER — Ambulatory Visit (INDEPENDENT_AMBULATORY_CARE_PROVIDER_SITE_OTHER): Admitting: Adult Health

## 2021-01-01 VITALS — BP 117/73 | HR 67 | Temp 98.0°F | Ht 73.0 in | Wt 212.0 lb

## 2021-01-01 DIAGNOSIS — R7303 Prediabetes: Secondary | ICD-10-CM | POA: Diagnosis not present

## 2021-01-01 DIAGNOSIS — E669 Obesity, unspecified: Secondary | ICD-10-CM

## 2021-01-01 DIAGNOSIS — E66811 Obesity, class 1: Secondary | ICD-10-CM

## 2021-01-02 ENCOUNTER — Other Ambulatory Visit: Payer: Self-pay | Admitting: Family Medicine

## 2021-01-02 NOTE — Telephone Encounter (Signed)
Last office visit 03/29/2020 for CPE.  Last refilled 12/03/2020 for #30 with no refills.  CPE scheduled for 04/02/2021.

## 2021-01-02 NOTE — Progress Notes (Signed)
Chief Complaint:   OBESITY Phillip Gomez is here to discuss his progress with his obesity treatment plan along with follow-up of his obesity related diagnoses. Phillip Gomez is on the Category 3 Plan and keeping a food journal and adhering to recommended goals of 450-600 calories and 40 grams of protein at supper and states he is following his eating plan approximately 80-85% of the time. Phillip Gomez states he is not exercising regularly.  Today's visit was #: 8 Starting weight: 241 lbs Starting date: 07/23/2020 Today's weight: 212 lbs Today's date: 01/01/2021 Total lbs lost to date: 29 lbs Total lbs lost since last in-office visit: 0  Interim History: Phillip Gomez has been eating out more the last few weeks. He will experience evening cravings when watching TV. He fells that he is eating out of boredom.  Reviewed [employer] results with pt.  Subjective:   1. Prediabetes Recently been experiencing nighttime cravings, while watching TV.  Lab Results  Component Value Date   HGBA1C 5.6 12/11/2020   Lab Results  Component Value Date   INSULIN 6.1 12/11/2020   INSULIN 17.7 07/23/2020   Assessment/Plan:   1. Prediabetes Increase protein and increase walking. When cravings, first drink 8-10 oz water. If still hungry, then choose higher protein snacks in evening.  2. Obesity with current BMI 28.0  Phillip Gomez is currently in the action stage of change. As such, his goal is to continue with weight loss efforts. He has agreed to the Category 3 Plan and keeping a food journal and adhering to recommended goals of 450-600 calories and 40 grams of protein at supper.   Exercise goals:  Walk 2 times per week for 15 minutes.  Behavioral modification strategies: increasing lean protein intake, decreasing simple carbohydrates, meal planning and cooking strategies, keeping healthy foods in the home, and planning for success.  Use higher protein snacks in the evening.  Phillip Gomez has agreed to follow-up with our  clinic in 2-3 weeks. He was informed of the importance of frequent follow-up visits to maximize his success with intensive lifestyle modifications for his multiple health conditions.   Objective:   Blood pressure 117/73, pulse 67, temperature 98 F (36.7 C), height 6\' 1"  (1.854 m), weight 212 lb (96.2 kg), SpO2 96 %. Body mass index is 27.97 kg/m.  General: Cooperative, alert, well developed, in no acute distress. HEENT: Conjunctivae and lids unremarkable. Cardiovascular: Regular rhythm.  Lungs: Normal work of breathing. Neurologic: No focal deficits.   Lab Results  Component Value Date   CREATININE 1.09 12/11/2020   BUN 27 (H) 12/11/2020   NA 142 12/11/2020   K 4.8 12/11/2020   CL 103 12/11/2020   CO2 25 12/11/2020   Lab Results  Component Value Date   ALT 20 12/11/2020   AST 24 12/11/2020   ALKPHOS 103 12/11/2020   BILITOT 0.6 12/11/2020   Lab Results  Component Value Date   HGBA1C 5.6 12/11/2020   HGBA1C 5.9 (H) 07/23/2020   Lab Results  Component Value Date   INSULIN 6.1 12/11/2020   INSULIN 17.7 07/23/2020   Lab Results  Component Value Date   TSH 3.040 04/17/2020   Lab Results  Component Value Date   CHOL 149 03/25/2020   HDL 36 (L) 03/25/2020   LDLCALC 88 03/25/2020   LDLDIRECT 81.0 02/14/2015   TRIG 139 03/25/2020   CHOLHDL 4.1 03/25/2020   Lab Results  Component Value Date   VD25OH 66.9 12/11/2020   VD25OH 35.4 07/23/2020   VD25OH 38.10 02/14/2015  Lab Results  Component Value Date   WBC 7.2 09/30/2016   HGB 16.5 09/30/2016   HCT 46.6 09/30/2016   MCV 90.7 09/30/2016   PLT 190 09/30/2016   Attestation Statements:   Reviewed by clinician on day of visit: allergies, medications, problem list, medical history, surgical history, family history, social history, and previous encounter notes.  Time spent on visit including pre-visit chart review and post-visit care and charting was 30 minutes.   I, Insurance claims handler, CMA, am acting as  Energy manager for William Hamburger, NP.  I have reviewed the above documentation for accuracy and completeness, and I agree with the above. -  Nayelie Gionfriddo d. Rajveer Handler, NP-C

## 2021-01-04 MED ORDER — METHYLPHENIDATE HCL ER (OSM) 27 MG PO TBCR: 27.0000 mg | EXTENDED_RELEASE_TABLET | Freq: Every day | ORAL | 0 refills | Status: DC

## 2021-01-04 NOTE — Telephone Encounter (Signed)
ERx 

## 2021-01-16 ENCOUNTER — Ambulatory Visit (INDEPENDENT_AMBULATORY_CARE_PROVIDER_SITE_OTHER): Admitting: Adult Health

## 2021-02-03 ENCOUNTER — Other Ambulatory Visit: Payer: Self-pay | Admitting: Family Medicine

## 2021-02-05 MED ORDER — METHYLPHENIDATE HCL ER (OSM) 27 MG PO TBCR: 27.0000 mg | EXTENDED_RELEASE_TABLET | Freq: Every day | ORAL | 0 refills | Status: DC

## 2021-02-05 NOTE — Telephone Encounter (Signed)
ERx 

## 2021-02-05 NOTE — Telephone Encounter (Signed)
Refill request Methylphenidate Last refill 01/04/21 #30 Last office visit 03/29/20 Upcoming appointment 04/02/21

## 2021-02-06 ENCOUNTER — Other Ambulatory Visit: Payer: Self-pay

## 2021-02-06 ENCOUNTER — Ambulatory Visit (INDEPENDENT_AMBULATORY_CARE_PROVIDER_SITE_OTHER): Admitting: Adult Health

## 2021-02-06 ENCOUNTER — Encounter (INDEPENDENT_AMBULATORY_CARE_PROVIDER_SITE_OTHER): Payer: Self-pay | Admitting: Adult Health

## 2021-02-06 VITALS — BP 115/68 | HR 57 | Temp 97.9°F | Ht 73.0 in | Wt 209.0 lb

## 2021-02-06 DIAGNOSIS — K219 Gastro-esophageal reflux disease without esophagitis: Secondary | ICD-10-CM

## 2021-02-06 DIAGNOSIS — E669 Obesity, unspecified: Secondary | ICD-10-CM | POA: Diagnosis not present

## 2021-02-06 DIAGNOSIS — R7303 Prediabetes: Secondary | ICD-10-CM | POA: Diagnosis not present

## 2021-02-10 NOTE — Progress Notes (Signed)
Chief Complaint:   OBESITY Phillip Gomez is here to discuss his progress with his obesity treatment plan along with follow-up of his obesity related diagnoses. Royale is on the Category 3 Plan and keeping a food journal and adhering to recommended goals of 450-600 calories and 40 grams of protein at supper and states he is following his eating plan approximately 70% of the time. Dago states he is walking for 15 minutes 2-3 times per week.  Today's visit was #: 9 Starting weight: 241 lbs Starting date: 07/23/2020 Today's weight: 209 lbs Today's date: 02/06/2021 Total lbs lost to date: 32 lbs Total lbs lost since last in-office visit: 0  Interim History: Stepfon continues to enjoy the structure and foods or the Category 3 meal plan.   He denies polyphagia when eating on plan.  Subjective:   1. Prediabetes On 12/11/2020, A1c 5.6, insulin level 6.1 - both improved from last check.  2. Gastroesophageal reflux disease, unspecified whether esophagitis present He is on Prilosec 20 mg daily - reports good control of acid reflux symptoms.  Assessment/Plan:   1. Prediabetes Continue with Category 3 and increase regular exercise/activity.  2. Gastroesophageal reflux disease, unspecified whether esophagitis present Continue daily Prilosec 20 mg daily.  3. Obesity with current BMI 27.6  Kemo is currently in the action stage of change. As such, his goal is to continue with weight loss efforts. He has agreed to the Category 3 Plan and keeping a food journal and adhering to recommended goals of 450-600 calories and 40 grams of protein at supper.   Band It Exercises sheet provided.  Exercise goals:  Band It exercises 2 times per week.  Behavioral modification strategies: increasing lean protein intake, decreasing simple carbohydrates, meal planning and cooking strategies, keeping healthy foods in the home, and planning for success.  Remigio has agreed to follow-up with our clinic in 3 weeks.  He was informed of the importance of frequent follow-up visits to maximize his success with intensive lifestyle modifications for his multiple health conditions.   Objective:   Blood pressure 115/68, pulse (!) 57, temperature 97.9 F (36.6 C), height 6\' 1"  (1.854 m), weight 209 lb (94.8 kg), SpO2 97 %. Body mass index is 27.57 kg/m.  General: Cooperative, alert, well developed, in no acute distress. HEENT: Conjunctivae and lids unremarkable. Cardiovascular: Regular rhythm.  Lungs: Normal work of breathing. Neurologic: No focal deficits.   Lab Results  Component Value Date   CREATININE 1.09 12/11/2020   BUN 27 (H) 12/11/2020   NA 142 12/11/2020   K 4.8 12/11/2020   CL 103 12/11/2020   CO2 25 12/11/2020   Lab Results  Component Value Date   ALT 20 12/11/2020   AST 24 12/11/2020   ALKPHOS 103 12/11/2020   BILITOT 0.6 12/11/2020   Lab Results  Component Value Date   HGBA1C 5.6 12/11/2020   HGBA1C 5.9 (H) 07/23/2020   Lab Results  Component Value Date   INSULIN 6.1 12/11/2020   INSULIN 17.7 07/23/2020   Lab Results  Component Value Date   TSH 3.040 04/17/2020   Lab Results  Component Value Date   CHOL 149 03/25/2020   HDL 36 (L) 03/25/2020   LDLCALC 88 03/25/2020   LDLDIRECT 81.0 02/14/2015   TRIG 139 03/25/2020   CHOLHDL 4.1 03/25/2020   Lab Results  Component Value Date   VD25OH 66.9 12/11/2020   VD25OH 35.4 07/23/2020   VD25OH 38.10 02/14/2015   Lab Results  Component Value Date  WBC 7.2 09/30/2016   HGB 16.5 09/30/2016   HCT 46.6 09/30/2016   MCV 90.7 09/30/2016   PLT 190 09/30/2016   Attestation Statements:   Reviewed by clinician on day of visit: allergies, medications, problem list, medical history, surgical history, family history, social history, and previous encounter notes.  Time spent on visit including pre-visit chart review and post-visit care and charting was 34 minutes.   I, Insurance claims handler, CMA, am acting as Energy manager for William Hamburger, NP.  I have reviewed the above documentation for accuracy and completeness, and I agree with the above. -  Annaliza Zia d. Jillisa Harris, NP-C

## 2021-02-27 ENCOUNTER — Other Ambulatory Visit: Payer: Self-pay

## 2021-02-27 ENCOUNTER — Encounter (INDEPENDENT_AMBULATORY_CARE_PROVIDER_SITE_OTHER): Payer: Self-pay | Admitting: Adult Health

## 2021-02-27 ENCOUNTER — Ambulatory Visit (INDEPENDENT_AMBULATORY_CARE_PROVIDER_SITE_OTHER): Admitting: Adult Health

## 2021-02-27 VITALS — BP 123/69 | HR 69 | Temp 98.1°F | Ht 73.0 in | Wt 213.0 lb

## 2021-02-27 DIAGNOSIS — E669 Obesity, unspecified: Secondary | ICD-10-CM

## 2021-02-27 DIAGNOSIS — R7303 Prediabetes: Secondary | ICD-10-CM | POA: Diagnosis not present

## 2021-03-03 NOTE — Progress Notes (Signed)
Chief Complaint:   OBESITY Phillip Gomez is here to discuss his progress with his obesity treatment plan along with follow-up of his obesity related diagnoses. Phillip Gomez is on the Category 3 Plan and keeping a food journal and adhering to recommended goals of 450-600 calories and 40 grams of protein at supper and states he is following his eating plan approximately 30% of the time. Phillip Gomez states he is not exercising regularly at this time.  Today's visit was #: 10 Starting weight: 241 lbs Starting date: 07/23/2020 Today's weight: 213 lbs Today's date: 02/27/2021 Total lbs lost to date: 28 lbs Total lbs lost since last in-office visit: 0  Interim History: Phillip Gomez says he has decreased motivation, which has resulted in 30% compliance of the Category 3 meal plan since last OV. He is setting up a new business, less time to exercise - ie, walk.  Subjective:   1. Prediabetes He denies polyphagia. He is not on any BG lowering medications. 12/11/20 BG 75, A1c 5.6, Insulin Level- 6.1- all improved from previous labs on 07/23/20  Assessment/Plan:   1. Prediabetes Category 3 meal plan compliance to 70-80%.  2. Obesity with current BMI 28.2  Phillip Gomez is currently in the action stage of change. As such, his goal is to continue with weight loss efforts. He has agreed to the Category 3 Plan.   Handout:  Thanksgiving Strategies.  Category 3 - 70-80% Compliance.  Exercise goals:  Increase daily walking.  Behavioral modification strategies: increasing lean protein intake, decreasing simple carbohydrates, meal planning and cooking strategies, keeping healthy foods in the home, and planning for success.  Phillip Gomez has agreed to follow-up with our clinic in 4 weeks. He was informed of the importance of frequent follow-up visits to maximize his success with intensive lifestyle modifications for his multiple health conditions.   Objective:   Blood pressure 123/69, pulse 69, temperature 98.1 F (36.7 C),  height 6\' 1"  (1.854 m), weight 213 lb (96.6 kg), SpO2 98 %. Body mass index is 28.1 kg/m.  General: Cooperative, alert, well developed, in no acute distress. HEENT: Conjunctivae and lids unremarkable. Cardiovascular: Regular rhythm.  Lungs: Normal work of breathing. Neurologic: No focal deficits.   Lab Results  Component Value Date   CREATININE 1.09 12/11/2020   BUN 27 (H) 12/11/2020   NA 142 12/11/2020   K 4.8 12/11/2020   CL 103 12/11/2020   CO2 25 12/11/2020   Lab Results  Component Value Date   ALT 20 12/11/2020   AST 24 12/11/2020   ALKPHOS 103 12/11/2020   BILITOT 0.6 12/11/2020   Lab Results  Component Value Date   HGBA1C 5.6 12/11/2020   HGBA1C 5.9 (H) 07/23/2020   Lab Results  Component Value Date   INSULIN 6.1 12/11/2020   INSULIN 17.7 07/23/2020   Lab Results  Component Value Date   TSH 3.040 04/17/2020   Lab Results  Component Value Date   CHOL 149 03/25/2020   HDL 36 (L) 03/25/2020   LDLCALC 88 03/25/2020   LDLDIRECT 81.0 02/14/2015   TRIG 139 03/25/2020   CHOLHDL 4.1 03/25/2020   Lab Results  Component Value Date   VD25OH 66.9 12/11/2020   VD25OH 35.4 07/23/2020   VD25OH 38.10 02/14/2015   Lab Results  Component Value Date   WBC 7.2 09/30/2016   HGB 16.5 09/30/2016   HCT 46.6 09/30/2016   MCV 90.7 09/30/2016   PLT 190 09/30/2016   Attestation Statements:   Reviewed by clinician on day of visit:  allergies, medications, problem list, medical history, surgical history, family history, social history, and previous encounter notes.  Time spent on visit including pre-visit chart review and post-visit care and charting was 28 minutes.   I, Insurance claims handler, CMA, am acting as Energy manager for William Hamburger, NP.  I have reviewed the above documentation for accuracy and completeness, and I agree with the above. -  Adean Milosevic d. Emmaleigh Longo, NP-C

## 2021-03-10 ENCOUNTER — Other Ambulatory Visit: Payer: Self-pay | Admitting: Family Medicine

## 2021-03-11 NOTE — Telephone Encounter (Signed)
Name of Medication: Methylphenidate Name of Pharmacy: [facility] Last Fill or Written Date and Quantity: 02/05/21, #30 Last Office Visit and Type: 03/29/20, CPE Next Office Visit and Type: 04/02/21, CPE Last Controlled Substance Agreement Date: 05/24/17 Last UDS: 02/07/19

## 2021-03-12 MED ORDER — METHYLPHENIDATE HCL ER (OSM) 27 MG PO TBCR: 27.0000 mg | EXTENDED_RELEASE_TABLET | Freq: Every day | ORAL | 0 refills | Status: DC

## 2021-03-12 NOTE — Telephone Encounter (Signed)
ERx 

## 2021-03-23 ENCOUNTER — Other Ambulatory Visit: Payer: Self-pay | Admitting: Family Medicine

## 2021-03-23 DIAGNOSIS — E559 Vitamin D deficiency, unspecified: Secondary | ICD-10-CM

## 2021-03-23 DIAGNOSIS — R7303 Prediabetes: Secondary | ICD-10-CM

## 2021-03-23 DIAGNOSIS — Z125 Encounter for screening for malignant neoplasm of prostate: Secondary | ICD-10-CM

## 2021-03-23 DIAGNOSIS — E785 Hyperlipidemia, unspecified: Secondary | ICD-10-CM

## 2021-03-23 DIAGNOSIS — E039 Hypothyroidism, unspecified: Secondary | ICD-10-CM

## 2021-03-26 ENCOUNTER — Other Ambulatory Visit

## 2021-03-27 ENCOUNTER — Ambulatory Visit (INDEPENDENT_AMBULATORY_CARE_PROVIDER_SITE_OTHER): Admitting: Adult Health

## 2021-04-02 ENCOUNTER — Encounter: Admitting: Family Medicine

## 2021-04-07 ENCOUNTER — Other Ambulatory Visit: Payer: Self-pay | Admitting: Family Medicine

## 2021-04-08 NOTE — Telephone Encounter (Signed)
Refill request for methylphenidate 27 MG PO CR tablet  LOV - 03/29/20 Next OV - not scheduled Last refill - 03/12/21 #30/0

## 2021-04-09 MED ORDER — METHYLPHENIDATE HCL ER (OSM) 27 MG PO TBCR: 27.0000 mg | EXTENDED_RELEASE_TABLET | Freq: Every day | ORAL | 0 refills | Status: DC

## 2021-04-09 NOTE — Telephone Encounter (Signed)
ERx 

## 2021-04-10 ENCOUNTER — Other Ambulatory Visit: Payer: Self-pay | Admitting: Family Medicine

## 2021-04-10 NOTE — Telephone Encounter (Signed)
Called pt to verify whether he was taking the fluoxetine. He is taking it daily. I sent it in for 90 days.

## 2021-04-14 ENCOUNTER — Other Ambulatory Visit: Payer: Self-pay | Admitting: Family Medicine

## 2021-04-15 NOTE — Telephone Encounter (Signed)
Please r/s patient for CPE and lab appointment and then send back. Thank you

## 2021-04-16 NOTE — Telephone Encounter (Signed)
Lvm for pt to call to schedule lab/cpe and also sent mychart letter

## 2021-04-17 NOTE — Telephone Encounter (Signed)
Called and scheduled pt for 04/28/20 at [facility]

## 2021-04-28 ENCOUNTER — Encounter: Admitting: Family Medicine

## 2021-05-05 ENCOUNTER — Ambulatory Visit (INDEPENDENT_AMBULATORY_CARE_PROVIDER_SITE_OTHER): Admitting: Family Medicine

## 2021-05-05 ENCOUNTER — Encounter: Payer: Self-pay | Admitting: Family Medicine

## 2021-05-05 ENCOUNTER — Other Ambulatory Visit: Payer: Self-pay

## 2021-05-05 VITALS — BP 124/82 | HR 69 | Temp 97.9°F | Ht 72.5 in | Wt 226.2 lb

## 2021-05-05 DIAGNOSIS — R7303 Prediabetes: Secondary | ICD-10-CM

## 2021-05-05 DIAGNOSIS — E785 Hyperlipidemia, unspecified: Secondary | ICD-10-CM | POA: Diagnosis not present

## 2021-05-05 DIAGNOSIS — Z0000 Encounter for general adult medical examination without abnormal findings: Secondary | ICD-10-CM | POA: Diagnosis not present

## 2021-05-05 DIAGNOSIS — E559 Vitamin D deficiency, unspecified: Secondary | ICD-10-CM

## 2021-05-05 DIAGNOSIS — Z125 Encounter for screening for malignant neoplasm of prostate: Secondary | ICD-10-CM

## 2021-05-05 DIAGNOSIS — N289 Disorder of kidney and ureter, unspecified: Secondary | ICD-10-CM

## 2021-05-05 DIAGNOSIS — E039 Hypothyroidism, unspecified: Secondary | ICD-10-CM

## 2021-05-05 DIAGNOSIS — F419 Anxiety disorder, unspecified: Secondary | ICD-10-CM

## 2021-05-05 DIAGNOSIS — F988 Other specified behavioral and emotional disorders with onset usually occurring in childhood and adolescence: Secondary | ICD-10-CM

## 2021-05-05 DIAGNOSIS — K219 Gastro-esophageal reflux disease without esophagitis: Secondary | ICD-10-CM

## 2021-05-05 DIAGNOSIS — E669 Obesity, unspecified: Secondary | ICD-10-CM

## 2021-05-05 LAB — PSA: PSA: 1.22 ng/mL (ref 0.10–4.00)

## 2021-05-05 LAB — HEMOGLOBIN A1C: Hgb A1c MFr Bld: 5.3 % (ref 4.6–6.5)

## 2021-05-05 LAB — COMPREHENSIVE METABOLIC PANEL
ALT: 24 U/L (ref 0–53)
AST: 24 U/L (ref 0–37)
Albumin: 4.4 g/dL (ref 3.5–5.2)
Alkaline Phosphatase: 82 U/L (ref 39–117)
BUN: 23 mg/dL (ref 6–23)
CO2: 29 mEq/L (ref 19–32)
Calcium: 9.5 mg/dL (ref 8.4–10.5)
Chloride: 101 mEq/L (ref 96–112)
Creatinine, Ser: 1.17 mg/dL (ref 0.40–1.50)
GFR: 70.53 mL/min (ref >60.00)
Glucose, Bld: 89 mg/dL (ref 70–99)
Potassium: 5 mEq/L (ref 3.5–5.1)
Sodium: 139 mEq/L (ref 135–145)
Total Bilirubin: 0.5 mg/dL (ref 0.2–1.2)
Total Protein: 7.5 g/dL (ref 6.0–8.3)

## 2021-05-05 LAB — LIPID PANEL
Cholesterol: 169 mg/dL (ref 0–200)
HDL: 43.7 mg/dL (ref >39.00)
LDL Cholesterol: 103 mg/dL — ABNORMAL HIGH (ref 0–99)
NonHDL: 125.07
Total CHOL/HDL Ratio: 4
Triglycerides: 111 mg/dL (ref 0.0–149.0)
VLDL: 22.2 mg/dL (ref 0.0–40.0)

## 2021-05-05 LAB — TSH: TSH: 3.49 u[IU]/mL (ref 0.35–5.50)

## 2021-05-05 LAB — VITAMIN D 25 HYDROXY (VIT D DEFICIENCY, FRACTURES): VITD: 50.78 ng/mL (ref 30.00–100.00)

## 2021-05-05 MED ORDER — FLUOXETINE HCL 10 MG PO CAPS: ORAL_CAPSULE | ORAL | 3 refills | Status: DC

## 2021-05-05 MED ORDER — FLUOXETINE HCL 20 MG PO CAPS: 20.0000 mg | ORAL_CAPSULE | Freq: Every day | ORAL | 3 refills | Status: DC

## 2021-05-05 MED ORDER — LEVOTHYROXINE SODIUM 137 MCG PO TABS: 137.0000 ug | ORAL_TABLET | Freq: Every day | ORAL | 3 refills | Status: DC

## 2021-05-05 MED ORDER — VITAMIN D3 25 MCG (1000 UT) PO CAPS: 1.0000 | ORAL_CAPSULE | Freq: Every day | ORAL | Status: DC

## 2021-05-05 NOTE — Assessment & Plan Note (Signed)
Update A1c ?

## 2021-05-05 NOTE — Assessment & Plan Note (Signed)
Update levels on replacement 

## 2021-05-05 NOTE — Assessment & Plan Note (Addendum)
Stable period on concerta 27mg  daily, tolerates well. Will update controlled substance agreement as he desires to switch pharmacies.

## 2021-05-05 NOTE — Progress Notes (Signed)
Patient ID: Phillip Gomez, male    DOB: 08/02/1965, 56 y.o.   MRN: [id number]  This visit was conducted in person.  BP 124/82    Pulse 69    Temp 97.9 F (36.6 C) (Temporal)    Ht 6' 0.5" (1.842 m)    Wt 226 lb 3 oz (102.6 kg)    SpO2 96%    BMI 30.25 kg/m    CC: CPE Subjective:   HPI: Phillip Gomez is a 56 y.o. male presenting on 05/05/2021 for Annual Exam   [facility].   Brother passed away Jun 16, 2019 from metastatic prostate cancer (55yo).    Notes trouble with sleep initiation insomnia. Has bedtime routine. Takes concerta first thing in the morning.    ADD - tolerating methylphenidate well. Denies headache, loss of appetite, or chest pain. Does not do stimulant holidays. Finds it really helps energy level.    Preventative: Colon cancer screening - colonsocopy 02/2017 - 3 polyps, rpt 5 yrs (through [place])  Prostate cancer screening - fmhx prostate cancer, continue yearly screen. No prostate symptoms.  Lung cancer screening - not eligible  Flu shot yearly COVID vaccine Pfizer 06/2019, 07/2019, booster 12/2019 Tdap 06-15-2012, 11/2017, 11/2018 Shingrix - 03/2020, 06/2020  Seat belt use discussed Sunscreen use discussed. No changing moles on skin.  Non smoker  Alcohol - rare  Dentist q6 mo Eye exam yearly    Lives with wife and FIL, MIL, occasionally step son Occupation: owns business - Scientist, product/process development. Retired from [employer] Activity: to start walking  Diet: good water, good fruits/vegetables      Relevant past medical, surgical, family and social history reviewed and updated as indicated. Interim medical history since our last visit reviewed. Allergies and medications reviewed and updated. Outpatient Medications Prior to Visit  Medication Sig Dispense Refill   methocarbamol (ROBAXIN) 500 MG tablet Take 500 mg by mouth every 8 (eight) hours as needed for muscle spasms.     methylphenidate 27 MG PO CR tablet Take 1 tablet (27 mg total) by  mouth daily. 30 tablet 0   omeprazole (PRILOSEC) 20 MG capsule TAKE 1 CAPSULE DAILY 90 capsule 0   Cholecalciferol (VITAMIN D3 PO) Take by mouth daily.     FLUoxetine (PROZAC) 10 MG capsule TAKE 1 CAPSULE DAILY (TAKE WITH 20 MG CAPSULE) 90 capsule 0   FLUoxetine (PROZAC) 20 MG capsule TAKE 1 CAPSULE DAILY (TAKE WITH 10 MG CAPSULE) 90 capsule 0   levothyroxine (SYNTHROID) 137 MCG tablet TAKE 1 TABLET DAILY BEFORE BREAKFAST 90 tablet 0   No facility-administered medications prior to visit.     Per HPI unless specifically indicated in ROS section below Review of Systems  Constitutional:  Negative for activity change, appetite change, chills, fatigue, fever and unexpected weight change.  HENT:  Negative for hearing loss.   Eyes:  Negative for visual disturbance.  Respiratory:  Negative for cough, chest tightness, shortness of breath and wheezing.   Cardiovascular:  Negative for chest pain, palpitations and leg swelling.  Gastrointestinal:  Negative for abdominal distention, abdominal pain, blood in stool, constipation, diarrhea, nausea and vomiting.  Genitourinary:  Negative for difficulty urinating and hematuria.  Musculoskeletal:  Negative for arthralgias, myalgias and neck pain.  Skin:  Negative for rash.  Neurological:  Negative for dizziness, seizures, syncope and headaches.  Hematological:  Negative for adenopathy. Does not bruise/bleed easily.  Psychiatric/Behavioral:  Negative for dysphoric mood. The patient is not nervous/anxious.  Objective:  BP 124/82    Pulse 69    Temp 97.9 F (36.6 C) (Temporal)    Ht 6' 0.5" (1.842 m)    Wt 226 lb 3 oz (102.6 kg)    SpO2 96%    BMI 30.25 kg/m   Wt Readings from Last 3 Encounters:  05/05/21 226 lb 3 oz (102.6 kg)  02/27/21 213 lb (96.6 kg)  02/06/21 209 lb (94.8 kg)      Physical Exam Vitals and nursing note reviewed.  Constitutional:      General: He is not in acute distress.    Appearance: Normal appearance. He is well-developed.  He is not ill-appearing.  HENT:     Head: Normocephalic and atraumatic.     Right Ear: Hearing, tympanic membrane, ear canal and external ear normal.     Left Ear: Hearing, tympanic membrane, ear canal and external ear normal.  Eyes:     General: No scleral icterus.    Extraocular Movements: Extraocular movements intact.     Conjunctiva/sclera: Conjunctivae normal.     Pupils: Pupils are equal, round, and reactive to light.  Neck:     Thyroid: No thyroid mass or thyromegaly.  Cardiovascular:     Rate and Rhythm: Normal rate and regular rhythm.     Pulses: Normal pulses.          Radial pulses are 2+ on the right side and 2+ on the left side.     Heart sounds: Normal heart sounds. No murmur heard. Pulmonary:     Effort: Pulmonary effort is normal. No respiratory distress.     Breath sounds: Normal breath sounds. No wheezing, rhonchi or rales.  Abdominal:     General: Bowel sounds are normal. There is no distension.     Palpations: Abdomen is soft. There is no mass.     Tenderness: There is no abdominal tenderness. There is no guarding or rebound.     Hernia: No hernia is present.  Genitourinary:    Prostate: Enlarged (slight). Not tender and no nodules present.     Rectum: Normal. No mass, tenderness, anal fissure, external hemorrhoid or internal hemorrhoid. Normal anal tone.  Musculoskeletal:        General: Normal range of motion.     Cervical back: Normal range of motion and neck supple.     Right lower leg: No edema.     Left lower leg: No edema.  Lymphadenopathy:     Cervical: No cervical adenopathy.  Skin:    General: Skin is warm and dry.     Findings: No rash.  Neurological:     General: No focal deficit present.     Mental Status: He is alert and oriented to person, place, and time.  Psychiatric:        Mood and Affect: Mood normal.        Behavior: Behavior normal.        Thought Content: Thought content normal.        Judgment: Judgment normal.      Results  for orders placed or performed in visit on 12/11/20  Comprehensive metabolic panel  Result Value Ref Range   Glucose 75 65 - 99 mg/dL   BUN 27 (H) 6 - 24 mg/dL   Creatinine, Ser 1.09 0.76 - 1.27 mg/dL   eGFR 81 >59 mL/min/1.73   BUN/Creatinine Ratio 25 (H) 9 - 20   Sodium 142 134 - 144 mmol/L   Potassium 4.8 3.5 - 5.2  mmol/L   Chloride 103 96 - 106 mmol/L   CO2 25 20 - 29 mmol/L   Calcium 9.7 8.7 - 10.2 mg/dL   Total Protein 7.6 6.0 - 8.5 g/dL   Albumin 4.6 3.8 - 4.9 g/dL   Globulin, Total 3.0 1.5 - 4.5 g/dL   Albumin/Globulin Ratio 1.5 1.2 - 2.2   Bilirubin Total 0.6 0.0 - 1.2 mg/dL   Alkaline Phosphatase 103 44 - 121 IU/L   AST 24 0 - 40 IU/L   ALT 20 0 - 44 IU/L  Hemoglobin A1c  Result Value Ref Range   Hgb A1c MFr Bld 5.6 4.8 - 5.6 %   Est. average glucose Bld gHb Est-mCnc 114 mg/dL  Insulin, random  Result Value Ref Range   INSULIN 6.1 2.6 - 24.9 uIU/mL  VITAMIN D 25 Hydroxy (Vit-D Deficiency, Fractures)  Result Value Ref Range   Vit D, 25-Hydroxy 66.9 30.0 - 100.0 ng/mL    Assessment & Plan:  This visit occurred during the SARS-CoV-2 public health emergency.  Safety protocols were in place, including screening questions prior to the visit, additional usage of staff PPE, and extensive cleaning of exam room while observing appropriate contact time as indicated for disinfecting solutions.   Problem List Items Addressed This Visit     Health maintenance examination - Primary (Chronic)    Preventative protocols reviewed and updated unless pt declined. Discussed healthy diet and lifestyle.       Hypothyroidism    Update levels on daily replacement.       Relevant Medications   levothyroxine (SYNTHROID) 137 MCG tablet   Attention deficit disorder (ADD) in adult    Stable period on concerta 89HT daily, tolerates well. Will update controlled substance agreement as he desires to switch pharmacies.       Anxiety    Chronic, stable period on prozac 31m daily -  continue.       Relevant Medications   FLUoxetine (PROZAC) 10 MG capsule   FLUoxetine (PROZAC) 20 MG capsule   GERD (gastroesophageal reflux disease)    Continue PPI.       Dyslipidemia    Update FLP off medication. The 10-year ASCVD risk score (Arnett DK, et al., 2019) is: 8.8%   Values used to calculate the score:     Age: [age]years     Sex: Male     Is Non-Hispanic African American: No     Diabetic: No     Tobacco smoker: Yes     Systolic Blood Pressure: 1342mmHg     Is BP treated: No     HDL Cholesterol: 36 mg/dL     Total Cholesterol: 149 mg/dL       Kidney lesion, native, left    Consider repeat imaging (last imaged 04/2020).       Obesity, Class I, BMI 30.0-34.9 (see actual BMI)    Now followed by healthy weight [facility] wellness center.       Prediabetes    Update A1c.       Vitamin D deficiency    Update levels on replacement.       Other Visit Diagnoses     Special screening for malignant neoplasm of prostate            Meds ordered this encounter  Medications   Cholecalciferol (VITAMIN D3) 25 MCG (1000 UT) CAPS    Sig: Take 1 capsule (1,000 Units total) by mouth daily.    Dispense:  30 capsule  FLUoxetine (PROZAC) 10 MG capsule    Sig: TAKE 1 CAPSULE DAILY (TAKE WITH 20 MG CAPSULE)    Dispense:  90 capsule    Refill:  3   levothyroxine (SYNTHROID) 137 MCG tablet    Sig: Take 1 tablet (137 mcg total) by mouth daily before breakfast.    Dispense:  90 tablet    Refill:  3   FLUoxetine (PROZAC) 20 MG capsule    Sig: Take 1 capsule (20 mg total) by mouth daily. With 80m for total 376mdose    Dispense:  90 capsule    Refill:  3   No orders of the defined types were placed in this encounter.    Patient instructions: Labs today  You are doing well today  We will be in touch with results.  Return as needed or in 1 year for next physical.  Update controlled substance agreement today.   Follow up plan: Return in about 1 year (around  05/05/2022), or if symptoms worsen or fail to improve, for annual exam, prior fasting for blood work.  JaRia BushMD

## 2021-05-05 NOTE — Assessment & Plan Note (Signed)
Chronic, stable period on prozac 30mg daily - continue.  

## 2021-05-05 NOTE — Assessment & Plan Note (Signed)
Preventative protocols reviewed and updated unless pt declined. Discussed healthy diet and lifestyle.  

## 2021-05-05 NOTE — Assessment & Plan Note (Signed)
Continue PPI ?

## 2021-05-05 NOTE — Assessment & Plan Note (Signed)
Consider repeat imaging (last imaged 04/2020).

## 2021-05-05 NOTE — Assessment & Plan Note (Signed)
Update FLP off medication. The 10-year ASCVD risk score (Arnett DK, et al., 2019) is: 8.8%   Values used to calculate the score:     Age: 56 years     Sex: Male     Is Non-Hispanic African American: No     Diabetic: No     Tobacco smoker: Yes     Systolic Blood Pressure: 124 mmHg     Is BP treated: No     HDL Cholesterol: 36 mg/dL     Total Cholesterol: 149 mg/dL

## 2021-05-05 NOTE — Assessment & Plan Note (Signed)
Update levels on daily replacement.  

## 2021-05-05 NOTE — Assessment & Plan Note (Signed)
Now followed by healthy weight [facility] wellness center.

## 2021-05-05 NOTE — Patient Instructions (Addendum)
Labs today  You are doing well today  We will be in touch with results.  Return as needed or in 1 year for next physical.  Update controlled substance agreement today.   Health Maintenance, Male Adopting a healthy lifestyle and getting preventive care are important in promoting health and wellness. Ask your health care provider about: The right schedule for you to have regular tests and exams. Things you can do on your own to prevent diseases and keep yourself healthy. What should I know about diet, weight, and exercise? Eat a healthy diet  Eat a diet that includes plenty of vegetables, fruits, low-fat dairy products, and lean protein. Do not eat a lot of foods that are high in solid fats, added sugars, or sodium. Maintain a healthy weight Body mass index (BMI) is a measurement that can be used to identify possible weight problems. It estimates body fat based on height and weight. Your health care provider can help determine your BMI and help you achieve or maintain a healthy weight. Get regular exercise Get regular exercise. This is one of the most important things you can do for your health. Most adults should: Exercise for at least 150 minutes each week. The exercise should increase your heart rate and make you sweat (moderate-intensity exercise). Do strengthening exercises at least twice a week. This is in addition to the moderate-intensity exercise. Spend less time sitting. Even light physical activity can be beneficial. Watch cholesterol and blood lipids Have your blood tested for lipids and cholesterol at 55 years of age, then have this test every 5 years. You may need to have your cholesterol levels checked more often if: Your lipid or cholesterol levels are high. You are older than 55 years of age. You are at high risk for heart disease. What should I know about cancer screening? Many types of cancers can be detected early and may often be prevented. Depending on your health  history and family history, you may need to have cancer screening at various ages. This may include screening for: Colorectal cancer. Prostate cancer. Skin cancer. Lung cancer. What should I know about heart disease, diabetes, and high blood pressure? Blood pressure and heart disease High blood pressure causes heart disease and increases the risk of stroke. This is more likely to develop in people who have high blood pressure readings or are overweight. Talk with your health care provider about your target blood pressure readings. Have your blood pressure checked: Every 3-5 years if you are 70-9 years of age. Every year if you are 75 years old or older. If you are between the ages of 60 and 62 and are a current or former smoker, ask your health care provider if you should have a one-time screening for abdominal aortic aneurysm (AAA). Diabetes Have regular diabetes screenings. This checks your fasting blood sugar level. Have the screening done: Once every three years after age 58 if you are at a normal weight and have a low risk for diabetes. More often and at a younger age if you are overweight or have a high risk for diabetes. What should I know about preventing infection? Hepatitis B If you have a higher risk for hepatitis B, you should be screened for this virus. Talk with your health care provider to find out if you are at risk for hepatitis B infection. Hepatitis C Blood testing is recommended for: Everyone born from [birth year] through 1964. Anyone with known risk factors for hepatitis C. Sexually transmitted infections (STIs)  You should be screened each year for STIs, including gonorrhea and chlamydia, if: You are sexually active and are younger than 55 years of age. You are older than 55 years of age and your health care provider tells you that you are at risk for this type of infection. Your sexual activity has changed since you were last screened, and you are at increased risk for  chlamydia or gonorrhea. Ask your health care provider if you are at risk. Ask your health care provider about whether you are at high risk for HIV. Your health care provider may recommend a prescription medicine to help prevent HIV infection. If you choose to take medicine to prevent HIV, you should first get tested for HIV. You should then be tested every 3 months for as long as you are taking the medicine. Follow these instructions at home: Alcohol use Do not drink alcohol if your health care provider tells you not to drink. If you drink alcohol: Limit how much you have to 0-2 drinks a day. Know how much alcohol is in your drink. In the [place] one drink equals one 12 oz bottle of beer (355 mL), one 5 oz glass of wine (148 mL), or one 1 oz glass of hard liquor (44 mL). Lifestyle Do not use any products that contain nicotine or tobacco. These products include cigarettes, chewing tobacco, and vaping devices, such as e-cigarettes. If you need help quitting, ask your health care provider. Do not use street drugs. Do not share needles. Ask your health care provider for help if you need support or information about quitting drugs. General instructions Schedule regular health, dental, and eye exams. Stay current with your vaccines. Tell your health care provider if: You often feel depressed. You have ever been abused or do not feel safe at home. Summary Adopting a healthy lifestyle and getting preventive care are important in promoting health and wellness. Follow your health care provider's instructions about healthy diet, exercising, and getting tested or screened for diseases. Follow your health care provider's instructions on monitoring your cholesterol and blood pressure. This information is not intended to replace advice given to you by your health care provider. Make sure you discuss any questions you have with your health care provider. Document Revised: 08/19/2020 Document Reviewed:  08/19/2020 Elsevier Patient Education  2022 [employer].

## 2021-05-09 ENCOUNTER — Other Ambulatory Visit: Payer: Self-pay | Admitting: Family Medicine

## 2021-05-09 NOTE — Telephone Encounter (Signed)
Name of Medication: Methylphenidate Name of Pharmacy: [facility] Last Fill or Written Date and Quantity: 04/09/21, #30 Last Office Visit and Type: 05/05/21, CPE Next Office Visit and Type: 05/11/22, CPE Last Controlled Substance Agreement Date: 05/24/17 Last UDS: 02/07/19

## 2021-05-10 ENCOUNTER — Encounter: Payer: Self-pay | Admitting: Family Medicine

## 2021-05-10 DIAGNOSIS — N289 Disorder of kidney and ureter, unspecified: Secondary | ICD-10-CM

## 2021-05-10 DIAGNOSIS — N2889 Other specified disorders of kidney and ureter: Secondary | ICD-10-CM

## 2021-05-10 MED ORDER — METHYLPHENIDATE HCL ER (OSM) 27 MG PO TBCR: 27.0000 mg | EXTENDED_RELEASE_TABLET | Freq: Every day | ORAL | 0 refills | Status: DC

## 2021-05-10 NOTE — Telephone Encounter (Signed)
ERx 

## 2021-05-15 ENCOUNTER — Encounter (INDEPENDENT_AMBULATORY_CARE_PROVIDER_SITE_OTHER): Payer: Self-pay | Admitting: Adult Health

## 2021-05-15 NOTE — Telephone Encounter (Signed)
Can you please assist with billing question.

## 2021-06-10 ENCOUNTER — Other Ambulatory Visit: Payer: Self-pay | Admitting: Family Medicine

## 2021-06-10 NOTE — Telephone Encounter (Signed)
Name of Medication: Methylphenidate Name of Pharmacy: [facility] Last Fill or Written Date and Quantity: 05/10/21 #30 Last Office Visit and Type: 05/05/21 Next Office Visit and Type: 05/11/22 Last Controlled Substance Agreement Date: 06/09/21 Last UDS:02/07/19

## 2021-06-11 MED ORDER — METHYLPHENIDATE HCL ER (OSM) 27 MG PO TBCR: 27.0000 mg | EXTENDED_RELEASE_TABLET | Freq: Every day | ORAL | 0 refills | Status: DC

## 2021-06-11 NOTE — Telephone Encounter (Signed)
ERx 

## 2021-07-02 IMAGING — CT CT ABDOMEN WO/W CM
3 of 12 series · 12 of 46 positions shown, 18 images · IV contrast (omnipaque)
Comparison: 04/11/2018

CLINICAL DATA: Follow-up of renal cyst.  Asymptomatic.

EXAM:
CT ABDOMEN WITHOUT AND WITH CONTRAST
TECHNIQUE: Multidetector CT imaging of the abdomen was performed following the
standard protocol before and following the bolus administration of
intravenous contrast.
CONTRAST:  100mL OMNIPAQUE IOHEXOL 300 MG/ML  SOLN

[Series 4: coronal without renal without 2.00 cor · coronal · non-contrast · 0.60mm/px · 2 of 163 slices shown, 3 images]
[im 55/163  soft-tissue]
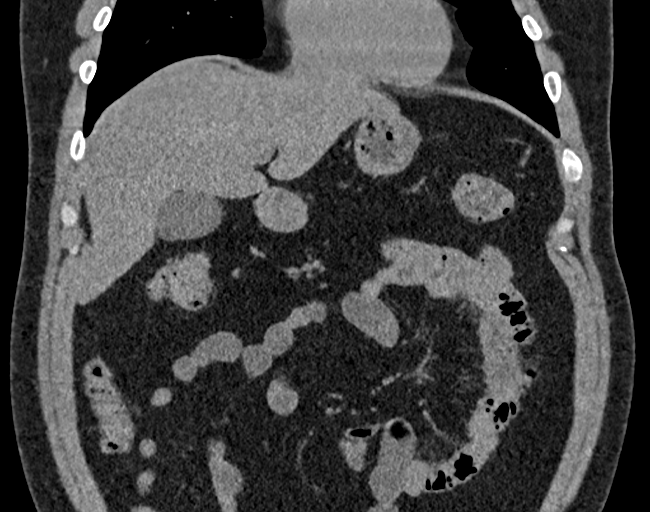
[im 55/163  bone]
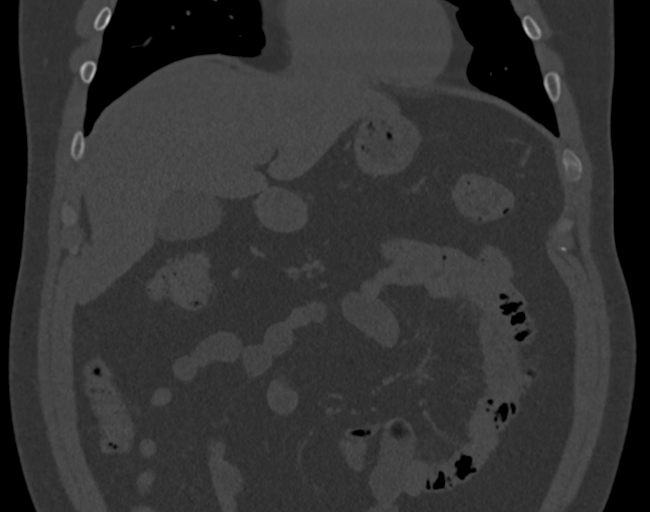
[im 109/163  soft-tissue]
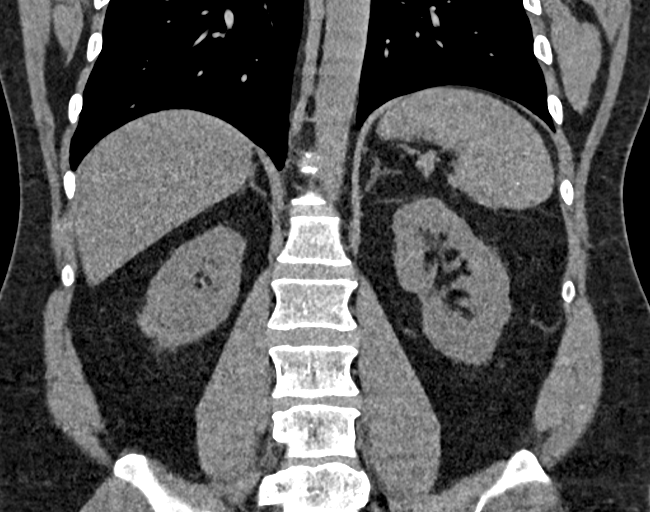

[Series 8: axial arterial renal arterial 2.00 · axial · arterial · 0.76mm/px · z∈[-1343,-1139]mm · 5 of 154 slices shown, 10 images]
[im 26/154  soft-tissue]
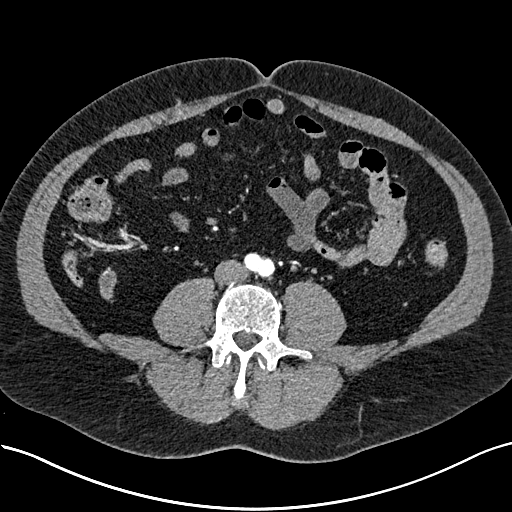
[im 26/154  bone]
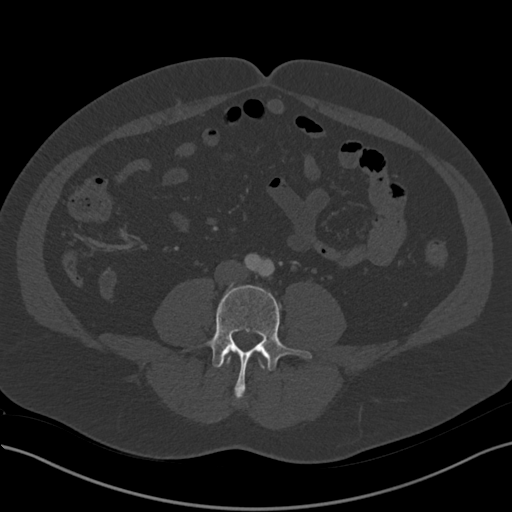
[im 52/154  soft-tissue]
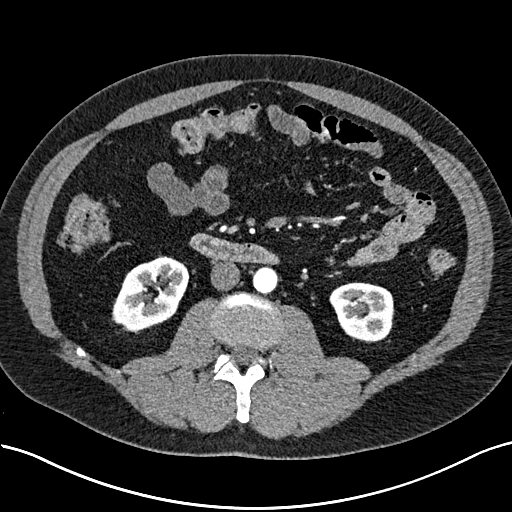
[im 52/154  lung]
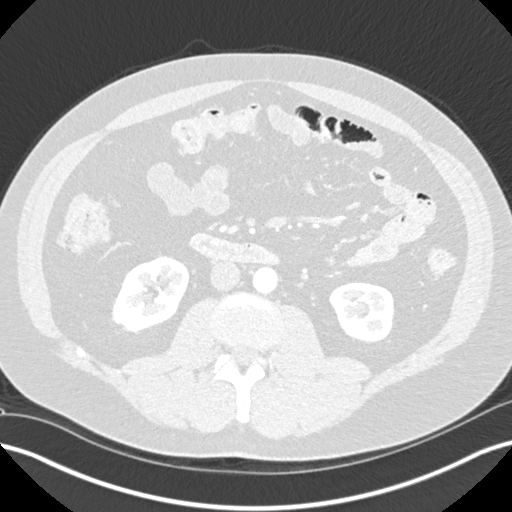
[im 77/154  soft-tissue]
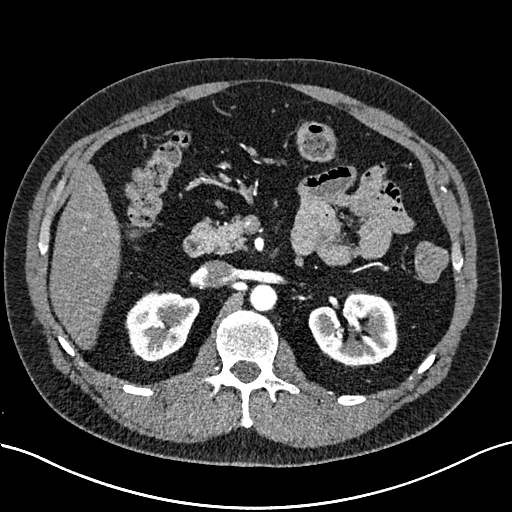
[im 77/154  lung]
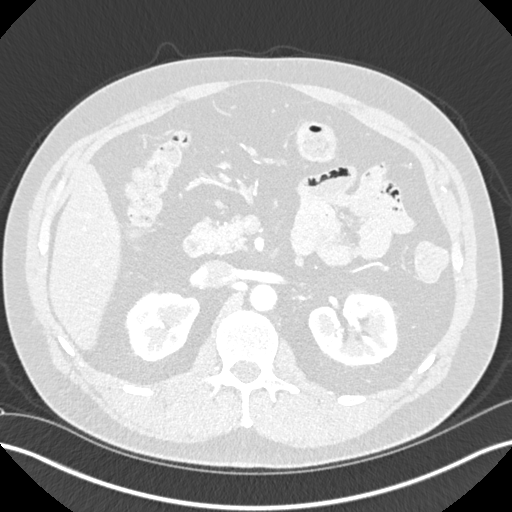
[im 103/154  soft-tissue]
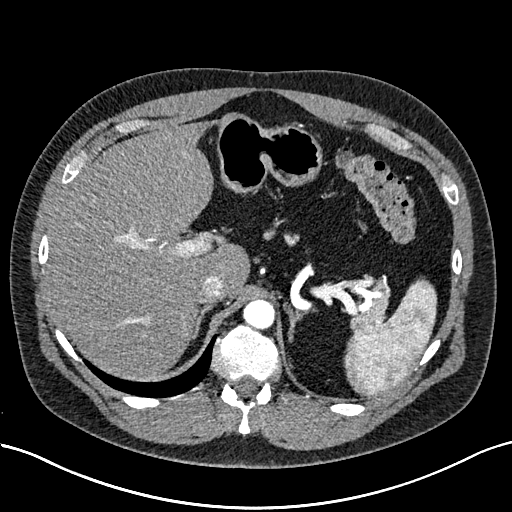
[im 103/154  lung]
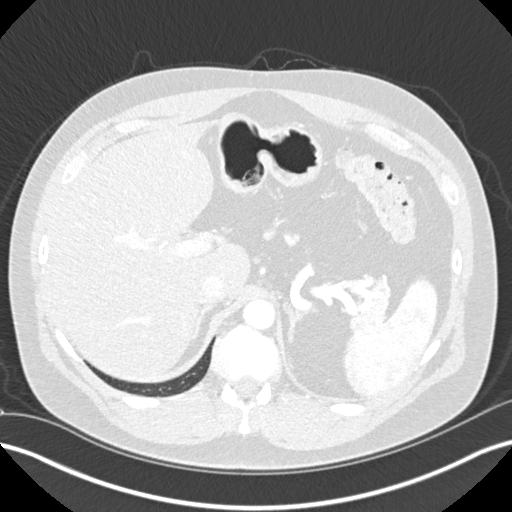
[im 128/154  soft-tissue]
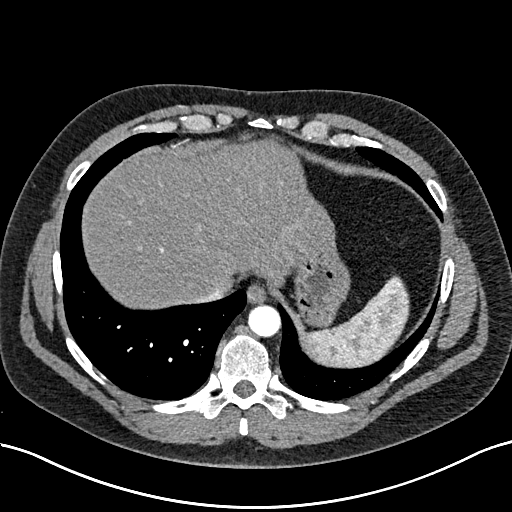
[im 128/154  lung]
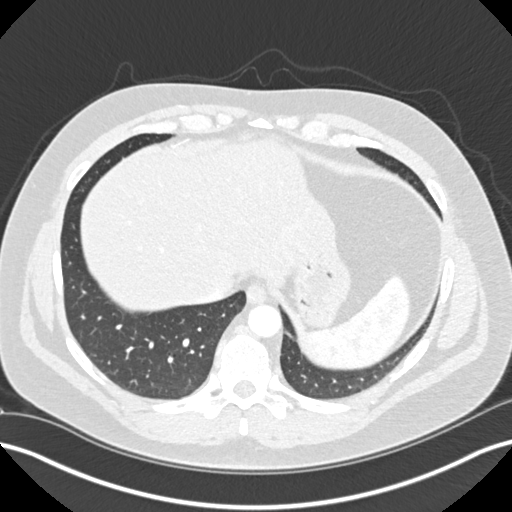

[Series 15: axial nephrographic renal nephrographic 2.00 · axial · 0.76mm/px · z∈[-1343,-1139]mm · 5 of 154 slices shown]
[im 26/154  soft-tissue]
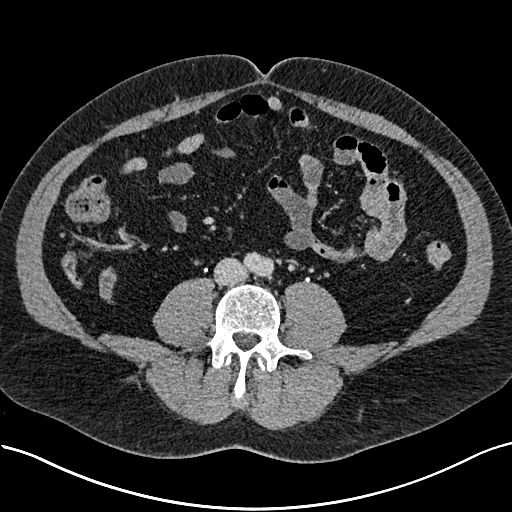
[im 52/154  soft-tissue]
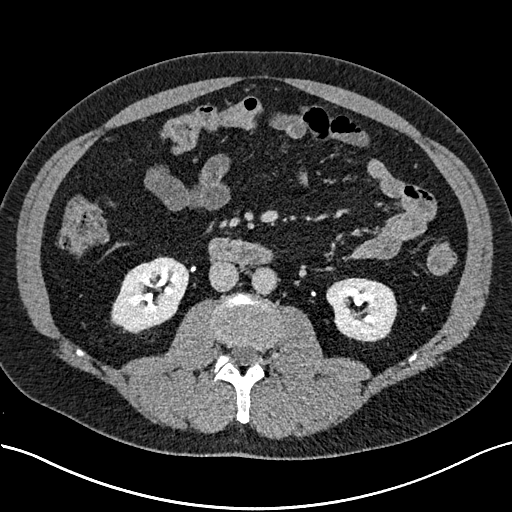
[im 77/154  soft-tissue]
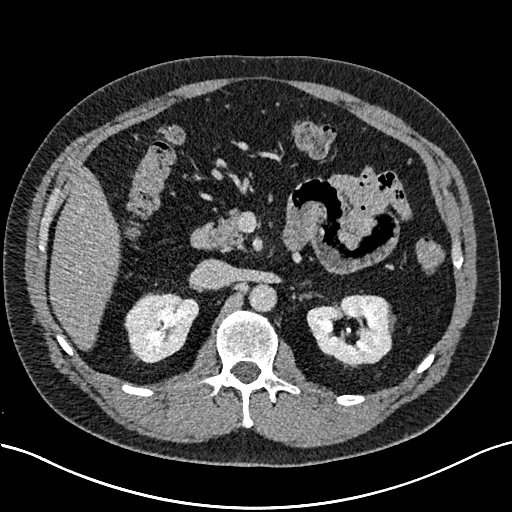
[im 103/154  soft-tissue]
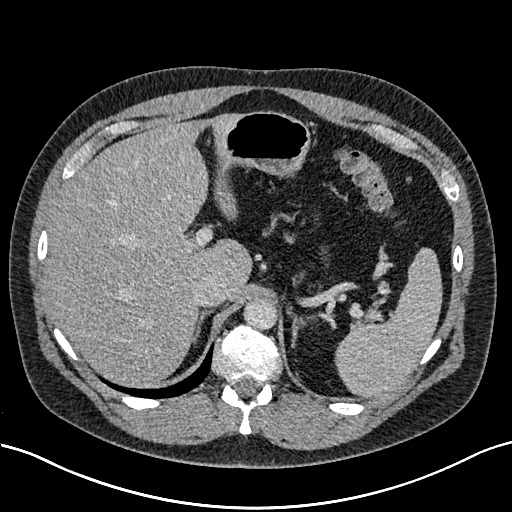
[im 128/154  soft-tissue]
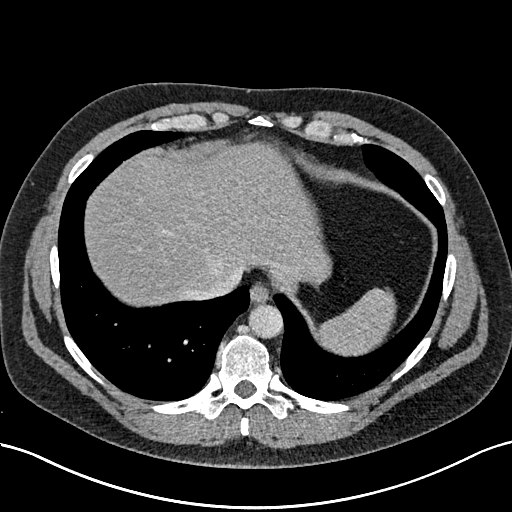

[12 of 46 positions shown; findings below may reference images not displayed]

FINDINGS: Lower chest: Calcified granulomas at the left lung base, including
at up to 1.4 cm. Normal heart size without pericardial or pleural
effusion.

Hepatobiliary: Mild hepatic steatosis. Normal gallbladder, without
biliary ductal dilatation.

Pancreas: Normal, without mass or ductal dilatation.

Spleen: Normal in size, without focal abnormality.

Adrenals/Urinary Tract: Normal adrenal glands. No renal calculi or
hydronephrosis.

Lower pole left renal lesion measures 1.8 x 1.6 cm on 111/15, and
demonstrates similar morphology to on the prior exam, including
calcification. This measures similar on the prior exam (when
remeasured).

Stomach/Bowel: Normal stomach, without wall thickening. Scattered
colonic diverticula. Normal terminal ileum and appendix. Normal
small bowel.

Vascular/Lymphatic: Normal caliber of the aorta and branch vessels.
No retroperitoneal or retrocrural adenopathy.

Other: No ascites.

Musculoskeletal: No acute osseous abnormality.
IMPRESSION: 1. Ongoing stability of a lower pole left renal Bosniak 2F lesion.
Consider follow-up pre and post-contrast CT at 12 months.
2.  No acute abdominal process.
3. Hepatic steatosis.

## 2021-07-08 ENCOUNTER — Other Ambulatory Visit: Payer: Self-pay | Admitting: Family Medicine

## 2021-07-08 NOTE — Telephone Encounter (Signed)
Name of Medication: Methylphenidate ?[facility] ?Last Fill or Written Date and Quantity: 06/11/21 #30 ?Last Office Visit and Type: 05/05/21 ?Next Office Visit and Type: 05/11/22 ?Last Controlled Substance Agreement Date: 06/09/21 ?Last UDS:10/*27/20 ? ? ?

## 2021-07-09 MED ORDER — METHYLPHENIDATE HCL ER (OSM) 27 MG PO TBCR: 27.0000 mg | EXTENDED_RELEASE_TABLET | Freq: Every day | ORAL | 0 refills | Status: DC

## 2021-07-09 NOTE — Telephone Encounter (Signed)
ERx 

## 2021-07-15 ENCOUNTER — Other Ambulatory Visit: Payer: Self-pay | Admitting: Family Medicine

## 2021-08-08 ENCOUNTER — Other Ambulatory Visit: Payer: Self-pay | Admitting: Family Medicine

## 2021-08-08 NOTE — Telephone Encounter (Signed)
Name of Medication: Methylphenidate ?Name of Pharmacy: [facility] ?Last Fill or Written Date and Quantity: 07/09/21, #30 ?Last Office Visit and Type: 05/05/21, CPE ?Next Office Visit and Type: 05/11/22, CPE ?Last Controlled Substance Agreement Date: 05/24/17 ?Last UDS: 02/07/19 ? ? ?

## 2021-08-11 MED ORDER — METHYLPHENIDATE HCL ER (OSM) 27 MG PO TBCR: 27.0000 mg | EXTENDED_RELEASE_TABLET | Freq: Every day | ORAL | 0 refills | Status: DC

## 2021-08-11 NOTE — Telephone Encounter (Signed)
ERx 

## 2021-08-13 ENCOUNTER — Encounter: Payer: Self-pay | Admitting: Family Medicine

## 2021-08-19 MED ORDER — METHYLPHENIDATE HCL ER (LA) 30 MG PO CP24: 30.0000 mg | ORAL_CAPSULE | ORAL | 0 refills | Status: DC

## 2021-08-19 NOTE — Progress Notes (Signed)
Methylphenidate shortage.  ?Rx Ritalin LA 30mg  daily ?Plz have pt call pharmacy to see if this is available.  ?

## 2021-11-13 ENCOUNTER — Other Ambulatory Visit: Payer: Self-pay | Admitting: Family Medicine

## 2021-11-13 NOTE — Telephone Encounter (Signed)
Name of Medication: Methylphenidate 27 mg Name of Pharmacy: [facility] Last Fill or Written Date and Quantity: 08/11/21, #30 Last Office Visit and Type: 05/05/21, CPE Next Office Visit and Type: 05/11/22, CPE Last Controlled Substance Agreement Date: 05/24/17 Last UDS: 02/07/19

## 2021-11-17 MED ORDER — METHYLPHENIDATE HCL ER (OSM) 27 MG PO TBCR: 27.0000 mg | EXTENDED_RELEASE_TABLET | Freq: Every day | ORAL | 0 refills | Status: DC

## 2021-11-17 NOTE — Telephone Encounter (Signed)
ERx 

## 2021-11-19 ENCOUNTER — Encounter (INDEPENDENT_AMBULATORY_CARE_PROVIDER_SITE_OTHER): Payer: Self-pay

## 2021-12-14 ENCOUNTER — Other Ambulatory Visit: Payer: Self-pay | Admitting: Family Medicine

## 2021-12-16 NOTE — Telephone Encounter (Signed)
Name of Medication: Methylphenidate Name of Pharmacy: [facility] Last Fill or Written Date and Quantity: 08/19/21, #30 Last Office Visit and Type: 05/05/21, CPE Next Office Visit and Type: 05/11/22, CPE Last Controlled Substance Agreement Date: 05/24/17 Last UDS: 02/07/19

## 2021-12-18 MED ORDER — METHYLPHENIDATE HCL ER (OSM) 27 MG PO TBCR: 27.0000 mg | EXTENDED_RELEASE_TABLET | Freq: Every day | ORAL | 0 refills | Status: DC

## 2021-12-18 NOTE — Telephone Encounter (Signed)
ERx 

## 2022-01-13 ENCOUNTER — Other Ambulatory Visit: Payer: Self-pay | Admitting: Family Medicine

## 2022-01-14 NOTE — Telephone Encounter (Signed)
Name of Medication: Methylphenidate 27 mg Name of Pharmacy: [facility] Last Fill or Written Date and Quantity: 12/18/21, #30 Last Office Visit and Type: 05/05/21, CPE Next Office Visit and Type: 05/11/22, CPE Last Controlled Substance Agreement Date: 2/11/129 Last UDS: 02/07/19

## 2022-01-15 MED ORDER — METHYLPHENIDATE HCL ER (OSM) 27 MG PO TBCR: 27.0000 mg | EXTENDED_RELEASE_TABLET | Freq: Every day | ORAL | 0 refills | Status: DC

## 2022-01-15 NOTE — Telephone Encounter (Signed)
ERx 

## 2022-02-17 ENCOUNTER — Other Ambulatory Visit: Payer: Self-pay | Admitting: Family Medicine

## 2022-02-18 ENCOUNTER — Telehealth: Admitting: Physician Assistant

## 2022-02-18 DIAGNOSIS — J069 Acute upper respiratory infection, unspecified: Secondary | ICD-10-CM

## 2022-02-18 MED ORDER — METHYLPHENIDATE HCL ER (OSM) 27 MG PO TBCR: 27.0000 mg | EXTENDED_RELEASE_TABLET | Freq: Every day | ORAL | 0 refills | Status: DC

## 2022-02-18 MED ORDER — BENZONATATE 100 MG PO CAPS: 100.0000 mg | ORAL_CAPSULE | Freq: Three times a day (TID) | ORAL | 0 refills | Status: DC | PRN

## 2022-02-18 MED ORDER — FLUTICASONE PROPIONATE 50 MCG/ACT NA SUSP: 2.0000 | Freq: Every day | NASAL | 0 refills | Status: DC

## 2022-02-18 NOTE — Progress Notes (Signed)
I have spent 5 minutes in review of e-visit questionnaire, review and updating patient chart, medical decision making and response to patient.   Dhamar Gregory Cody Joeann Steppe, PA-C    

## 2022-02-18 NOTE — Telephone Encounter (Signed)
ERx 

## 2022-02-18 NOTE — Progress Notes (Signed)

## 2022-02-18 NOTE — Telephone Encounter (Signed)
Refill request Methylphenidate 27 mg Last refill 01/15/22 #30 Last office visit 05/05/21 Upcoming appointment 05/11/22

## 2022-02-21 ENCOUNTER — Telehealth: Admitting: Nurse Practitioner

## 2022-02-21 DIAGNOSIS — J40 Bronchitis, not specified as acute or chronic: Secondary | ICD-10-CM

## 2022-02-21 MED ORDER — PSEUDOEPH-BROMPHEN-DM 30-2-10 MG/5ML PO SYRP: 5.0000 mL | ORAL_SOLUTION | Freq: Four times a day (QID) | ORAL | 0 refills | Status: DC | PRN

## 2022-02-21 MED ORDER — AZITHROMYCIN 250 MG PO TABS: ORAL_TABLET | ORAL | 0 refills | Status: AC

## 2022-02-21 NOTE — Progress Notes (Signed)
We are sorry that you are not feeling well.  Here is how we plan to help!  Based on your presentation I believe you most likely have A cough due to bacteria.  When patients have a fever and a productive cough with a change in color or increased sputum production, we are concerned about bacterial bronchitis.  If left untreated it can progress to pneumonia.  If your symptoms do not improve with your treatment plan it is important that you contact your provider.   I have prescribed Azithromyin 250 mg: two tablets now and then one tablet daily for 4 additonal days    In addition you may use A prescription cough syrup that was sent to the pharmacy.   From your responses in the eVisit questionnaire you describe inflammation in the upper respiratory tract which is causing a significant cough.  This is commonly called Bronchitis and has four common causes:   Allergies Viral Infections Acid Reflux Bacterial Infection Allergies, viruses and acid reflux are treated by controlling symptoms or eliminating the cause. An example might be a cough caused by taking certain blood pressure medications. You stop the cough by changing the medication. Another example might be a cough caused by acid reflux. Controlling the reflux helps control the cough.  USE OF BRONCHODILATOR ("RESCUE") INHALERS: There is a risk from using your bronchodilator too frequently.  The risk is that over-reliance on a medication which only relaxes the muscles surrounding the breathing tubes can reduce the effectiveness of medications prescribed to reduce swelling and congestion of the tubes themselves.  Although you feel brief relief from the bronchodilator inhaler, your asthma may actually be worsening with the tubes becoming more swollen and filled with mucus.  This can delay other crucial treatments, such as oral steroid medications. If you need to use a bronchodilator inhaler daily, several times per day, you should discuss this with your  provider.  There are probably better treatments that could be used to keep your asthma under control.     HOME CARE Only take medications as instructed by your medical team. Complete the entire course of an antibiotic. Drink plenty of fluids and get plenty of rest. Avoid close contacts especially the very young and the elderly Cover your mouth if you cough or cough into your sleeve. Always remember to wash your hands A steam or ultrasonic humidifier can help congestion.   GET HELP RIGHT AWAY IF: You develop worsening fever. You become short of breath You cough up blood. Your symptoms persist after you have completed your treatment plan MAKE SURE YOU  Understand these instructions. Will watch your condition. Will get help right away if you are not doing well or get worse.    Thank you for choosing an e-visit.  Your e-visit answers were reviewed by a board certified advanced clinical practitioner to complete your personal care plan. Depending upon the condition, your plan could have included both over the counter or prescription medications.  Please review your pharmacy choice. Make sure the pharmacy is open so you can pick up prescription now. If there is a problem, you may contact your provider through [employer] and have the prescription routed to another pharmacy.  Your safety is important to [place]. If you have drug allergies check your prescription carefully.   For the next 24 hours you can use MyChart to ask questions about today's visit, request a non-urgent call back, or ask for a work or school excuse. You will get an email  in the next two days asking about your experience. I hope that your e-visit has been valuable and will speed your recovery.

## 2022-02-21 NOTE — Progress Notes (Signed)
I have spent 5 minutes in review of e-visit questionnaire, review and updating patient chart, medical decision making and response to patient.  ° °Akanksha Bellmore W Patti Shorb, NP ° °  °

## 2022-03-16 ENCOUNTER — Other Ambulatory Visit: Payer: Self-pay | Admitting: Family Medicine

## 2022-03-17 NOTE — Telephone Encounter (Signed)
Name of Medication: Methylphenidate CR 27 mg Name of Pharmacy: [facility] Last Fill or Written Date and Quantity: 02/18/22, #30 Last Office Visit and Type: 05/05/21, CPE Next Office Visit and Type: 05/11/22, CPE Last Controlled Substance Agreement Date: 05/24/17 Last UDS: 02/07/19

## 2022-03-18 MED ORDER — METHYLPHENIDATE HCL ER (OSM) 27 MG PO TBCR: 27.0000 mg | EXTENDED_RELEASE_TABLET | Freq: Every day | ORAL | 0 refills | Status: DC

## 2022-03-18 NOTE — Telephone Encounter (Signed)
ERx 

## 2022-04-14 ENCOUNTER — Other Ambulatory Visit: Payer: Self-pay | Admitting: Family Medicine

## 2022-04-14 DIAGNOSIS — K219 Gastro-esophageal reflux disease without esophagitis: Secondary | ICD-10-CM

## 2022-04-14 MED ORDER — METHYLPHENIDATE HCL ER (OSM) 27 MG PO TBCR: 27.0000 mg | EXTENDED_RELEASE_TABLET | Freq: Every day | ORAL | 0 refills | Status: DC

## 2022-04-14 NOTE — Telephone Encounter (Signed)
Duplicate request

## 2022-04-14 NOTE — Telephone Encounter (Signed)
ERx 

## 2022-05-04 ENCOUNTER — Other Ambulatory Visit: Payer: Self-pay | Admitting: Family Medicine

## 2022-05-04 DIAGNOSIS — Z125 Encounter for screening for malignant neoplasm of prostate: Secondary | ICD-10-CM

## 2022-05-04 DIAGNOSIS — E039 Hypothyroidism, unspecified: Secondary | ICD-10-CM

## 2022-05-04 DIAGNOSIS — E785 Hyperlipidemia, unspecified: Secondary | ICD-10-CM

## 2022-05-04 DIAGNOSIS — E559 Vitamin D deficiency, unspecified: Secondary | ICD-10-CM

## 2022-05-04 DIAGNOSIS — E291 Testicular hypofunction: Secondary | ICD-10-CM

## 2022-05-04 DIAGNOSIS — R7303 Prediabetes: Secondary | ICD-10-CM

## 2022-05-05 ENCOUNTER — Other Ambulatory Visit (INDEPENDENT_AMBULATORY_CARE_PROVIDER_SITE_OTHER)

## 2022-05-05 DIAGNOSIS — Z125 Encounter for screening for malignant neoplasm of prostate: Secondary | ICD-10-CM

## 2022-05-05 DIAGNOSIS — E785 Hyperlipidemia, unspecified: Secondary | ICD-10-CM | POA: Diagnosis not present

## 2022-05-05 DIAGNOSIS — E039 Hypothyroidism, unspecified: Secondary | ICD-10-CM

## 2022-05-05 DIAGNOSIS — R7303 Prediabetes: Secondary | ICD-10-CM

## 2022-05-05 DIAGNOSIS — E559 Vitamin D deficiency, unspecified: Secondary | ICD-10-CM

## 2022-05-05 NOTE — Addendum Note (Signed)
Addended by: Ellamae Sia on: 05/05/2022 07:24 AM   Modules accepted: Orders

## 2022-05-06 LAB — LIPID PANEL
Chol/HDL Ratio: 4.3 ratio (ref 0.0–5.0)
Cholesterol, Total: 154 mg/dL (ref 100–199)
HDL: 36 mg/dL — ABNORMAL LOW (ref >39)
LDL Chol Calc (NIH): 91 mg/dL (ref 0–99)
Triglycerides: 154 mg/dL — ABNORMAL HIGH (ref 0–149)
VLDL Cholesterol Cal: 27 mg/dL (ref 5–40)

## 2022-05-06 LAB — COMPREHENSIVE METABOLIC PANEL
ALT: 37 IU/L (ref 0–44)
AST: 28 IU/L (ref 0–40)
Albumin/Globulin Ratio: 1.6 (ref 1.2–2.2)
Albumin: 4.5 g/dL (ref 3.8–4.9)
Alkaline Phosphatase: 103 IU/L (ref 44–121)
BUN/Creatinine Ratio: 18 (ref 9–20)
BUN: 21 mg/dL (ref 6–24)
Bilirubin Total: 0.4 mg/dL (ref 0.0–1.2)
CO2: 24 mmol/L (ref 20–29)
Calcium: 9.5 mg/dL (ref 8.7–10.2)
Chloride: 102 mmol/L (ref 96–106)
Creatinine, Ser: 1.14 mg/dL (ref 0.76–1.27)
Globulin, Total: 2.9 g/dL (ref 1.5–4.5)
Glucose: 109 mg/dL — ABNORMAL HIGH (ref 70–99)
Potassium: 5.2 mmol/L (ref 3.5–5.2)
Sodium: 142 mmol/L (ref 134–144)
Total Protein: 7.4 g/dL (ref 6.0–8.5)
eGFR: 76 mL/min/{1.73_m2} (ref >59)

## 2022-05-06 LAB — TSH: TSH: 4.07 u[IU]/mL (ref 0.450–4.500)

## 2022-05-06 LAB — HEMOGLOBIN A1C
Est. average glucose Bld gHb Est-mCnc: 111 mg/dL
Hgb A1c MFr Bld: 5.5 % (ref 4.8–5.6)

## 2022-05-06 LAB — VITAMIN D 25 HYDROXY (VIT D DEFICIENCY, FRACTURES): Vit D, 25-Hydroxy: 42.2 ng/mL (ref 30.0–100.0)

## 2022-05-06 LAB — PSA: Prostate Specific Ag, Serum: 1.5 ng/mL (ref 0.0–4.0)

## 2022-05-11 ENCOUNTER — Ambulatory Visit (INDEPENDENT_AMBULATORY_CARE_PROVIDER_SITE_OTHER): Admitting: Family Medicine

## 2022-05-11 ENCOUNTER — Encounter: Payer: Self-pay | Admitting: Family Medicine

## 2022-05-11 VITALS — BP 132/84 | HR 59 | Temp 97.4°F | Ht 72.5 in | Wt 245.2 lb

## 2022-05-11 DIAGNOSIS — E785 Hyperlipidemia, unspecified: Secondary | ICD-10-CM

## 2022-05-11 DIAGNOSIS — N2889 Other specified disorders of kidney and ureter: Secondary | ICD-10-CM | POA: Diagnosis not present

## 2022-05-11 DIAGNOSIS — N289 Disorder of kidney and ureter, unspecified: Secondary | ICD-10-CM

## 2022-05-11 DIAGNOSIS — E559 Vitamin D deficiency, unspecified: Secondary | ICD-10-CM

## 2022-05-11 DIAGNOSIS — E039 Hypothyroidism, unspecified: Secondary | ICD-10-CM

## 2022-05-11 DIAGNOSIS — Z0000 Encounter for general adult medical examination without abnormal findings: Secondary | ICD-10-CM

## 2022-05-11 DIAGNOSIS — K219 Gastro-esophageal reflux disease without esophagitis: Secondary | ICD-10-CM | POA: Diagnosis not present

## 2022-05-11 DIAGNOSIS — E669 Obesity, unspecified: Secondary | ICD-10-CM

## 2022-05-11 DIAGNOSIS — F988 Other specified behavioral and emotional disorders with onset usually occurring in childhood and adolescence: Secondary | ICD-10-CM

## 2022-05-11 DIAGNOSIS — F330 Major depressive disorder, recurrent, mild: Secondary | ICD-10-CM

## 2022-05-11 DIAGNOSIS — R7303 Prediabetes: Secondary | ICD-10-CM

## 2022-05-11 DIAGNOSIS — K760 Fatty (change of) liver, not elsewhere classified: Secondary | ICD-10-CM

## 2022-05-11 MED ORDER — FLUOXETINE HCL 10 MG PO CAPS: ORAL_CAPSULE | ORAL | 4 refills | Status: DC

## 2022-05-11 MED ORDER — METHYLPHENIDATE HCL ER (OSM) 27 MG PO TBCR: 27.0000 mg | EXTENDED_RELEASE_TABLET | ORAL | 0 refills | Status: DC

## 2022-05-11 MED ORDER — OMEPRAZOLE 20 MG PO CPDR: 20.0000 mg | DELAYED_RELEASE_CAPSULE | Freq: Every day | ORAL | 4 refills | Status: DC

## 2022-05-11 MED ORDER — METHYLPHENIDATE HCL ER (OSM) 27 MG PO TBCR: 27.0000 mg | EXTENDED_RELEASE_TABLET | Freq: Every day | ORAL | 0 refills | Status: DC

## 2022-05-11 MED ORDER — FLUOXETINE HCL 20 MG PO CAPS: 20.0000 mg | ORAL_CAPSULE | Freq: Every day | ORAL | 4 refills | Status: DC

## 2022-05-11 MED ORDER — VITAMIN D3 ADULT GUMMIES 25 MCG (1000 UT) PO CHEW: 1.0000 | CHEWABLE_TABLET | Freq: Every day | ORAL | Status: DC

## 2022-05-11 MED ORDER — LEVOTHYROXINE SODIUM 137 MCG PO TABS: 137.0000 ug | ORAL_TABLET | Freq: Every day | ORAL | 4 refills | Status: DC

## 2022-05-11 NOTE — Assessment & Plan Note (Signed)
Chronic, stable period on methylphenidate 27mg  daily. Tolerating well and benefits from this medication to help control ADHD symptoms - will refill.

## 2022-05-11 NOTE — Patient Instructions (Addendum)
You are doing well today Work on regular exercise routine.  Return as needed or in 1 year for next physical.

## 2022-05-11 NOTE — Assessment & Plan Note (Signed)
Chronic, stable off medication. The 10-year ASCVD risk score (Arnett DK, et al., 2019) is: 5.8%   Values used to calculate the score:     Age: 57 years     Sex: Male     Is Non-Hispanic African American: No     Diabetic: No     Tobacco smoker: No     Systolic Blood Pressure: 144 mmHg     Is BP treated: No     HDL Cholesterol: 36 mg/dL     Total Cholesterol: 154 mg/dL

## 2022-05-11 NOTE — Assessment & Plan Note (Signed)
Preventative protocols reviewed and updated unless pt declined. Discussed healthy diet and lifestyle.  

## 2022-05-11 NOTE — Assessment & Plan Note (Signed)
Well controlled on daily lose dose PPI - refilled today.

## 2022-05-11 NOTE — Assessment & Plan Note (Signed)
Chronic, stable period on prozac 30mg  daily - continue.

## 2022-05-11 NOTE — Assessment & Plan Note (Signed)
Due for repeat imaging -will order renal protocol MR.

## 2022-05-11 NOTE — Assessment & Plan Note (Signed)
Chronic, TSH trending up. Discussed this. Denies hypothyroid symptoms so no change in levothyroxine dose made.

## 2022-05-11 NOTE — Progress Notes (Signed)
Patient ID: Phillip Gomez, male    DOB: April 09, 1966, 57 y.o.   MRN: [id number]  This visit was conducted in person.  BP 132/84   Pulse (!) 59   Temp (!) 97.4 F (36.3 C) (Temporal)   Ht 6' 0.5" (1.842 m)   Wt 245 lb 4 oz (111.2 kg)   SpO2 97%   BMI 32.80 kg/m    CC: CPE Subjective:   HPI: Phillip Gomez is a 57 y.o. male presenting on 05/11/2022 for Annual Exam   Sees [place].   Weight gain noted. Previously saw healthy weight and wellness visit, then about 30 lb weight gain this past year. Watching diet. Wife had bariatric surgery.   ADD - tolerating methylphenidate 27mg  well. Denies headache, loss of appetite, insomnia, or chest pain. Does not do stimulant holidays. Finds it really helps energy level.    Preventative: Colonoscopy 02/2017 - 3 polyps, rpt 5 yrs (through 03/2017)  Prostate cancer screening - fmhx prostate cancer (brother), continue yearly screen. No prostate symptoms. Nocturia x2-3, not bothersome.  Lung cancer screening - not eligible  Flu shot yearly COVID vaccine Pfizer 06/2019, 07/2019, booster 12/2019, 12/2021 Prevnar-20 12/2021 Tdap 2014, 11/2017, 11/2018 Shingrix - 03/2020, 06/2020  Seat belt use discussed Sunscreen use discussed. No changing moles on skin.  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 Interim medical history since our last visit reviewed. Allergies and medications reviewed and updated. Outpatient Medications Prior to Visit  Medication Sig Dispense Refill   FLUoxetine (PROZAC) 10 MG capsule TAKE 1 CAPSULE DAILY (TAKE WITH 20 MG CAPSULE) 90 capsule 3   FLUoxetine  (PROZAC) 20 MG capsule Take 1 capsule (20 mg total) by mouth daily. With 10mg  for total 30mg  dose 90 capsule 3   levothyroxine (SYNTHROID) 137 MCG tablet Take 1 tablet (137 mcg total) by mouth daily before breakfast. 90 tablet 3   methylphenidate 27 MG PO CR tablet Take 1 tablet (27 mg total) by mouth daily. 30 tablet 0   omeprazole (PRILOSEC) 20 MG capsule TAKE 1 CAPSULE DAILY 90 capsule 0   benzonatate (TESSALON) 100 MG capsule Take 1 capsule (100 mg total) by mouth 3 (three) times daily as needed for cough. 30 capsule 0   brompheniramine-pseudoephedrine-DM 30-2-10 MG/5ML syrup Take 5 mLs by mouth 4 (four) times daily as needed. 240 mL 0   Cholecalciferol (VITAMIN D3) 25 MCG (1000 UT) CAPS Take 1 capsule (1,000 Units total) by mouth daily. 30 capsule    fluticasone (FLONASE) 50 MCG/ACT nasal spray Place 2 sprays into both nostrils daily. 16 g 0   methocarbamol (ROBAXIN) 500 MG tablet Take 500 mg by mouth every 8 (eight) hours as needed for muscle spasms.     methylphenidate (RITALIN LA) 30 MG 24 hr capsule Take 1 capsule (30 mg total) by mouth every morning. 30 capsule 0   No facility-administered medications prior to visit.     Per HPI unless specifically indicated in ROS section below Review of Systems  Constitutional:  Negative for activity change, appetite change, chills, fatigue, fever and unexpected weight change.  HENT:  Negative for hearing  loss.   Eyes:  Negative for visual disturbance.  Respiratory:  Negative for cough, chest tightness, shortness of breath and wheezing.   Cardiovascular:  Negative for chest pain, palpitations and leg swelling.  Gastrointestinal:  Negative for abdominal distention, abdominal pain, blood in stool, constipation, diarrhea, nausea and vomiting.  Genitourinary:  Negative for difficulty urinating and hematuria.  Musculoskeletal:  Negative for arthralgias, myalgias and neck pain.  Skin:  Negative for rash.  Neurological:  Negative for dizziness,  seizures, syncope and headaches.  Hematological:  Negative for adenopathy. Does not bruise/bleed easily.  Psychiatric/Behavioral:  Negative for dysphoric mood. The patient is not nervous/anxious.     Objective:  BP 132/84   Pulse (!) 59   Temp (!) 97.4 F (36.3 C) (Temporal)   Ht 6' 0.5" (1.842 m)   Wt 245 lb 4 oz (111.2 kg)   SpO2 97%   BMI 32.80 kg/m   Wt Readings from Last 3 Encounters:  05/11/22 245 lb 4 oz (111.2 kg)  05/05/21 226 lb 3 oz (102.6 kg)  02/27/21 213 lb (96.6 kg)      Physical Exam Vitals and nursing note reviewed.  Constitutional:      General: He is not in acute distress.    Appearance: Normal appearance. He is well-developed. He is not ill-appearing.  HENT:     Head: Normocephalic and atraumatic.     Right Ear: Hearing, tympanic membrane, ear canal and external ear normal.     Left Ear: Hearing, tympanic membrane, ear canal and external ear normal.     Mouth/Throat:     Comments: Wearing mask Eyes:     General: No scleral icterus.    Extraocular Movements: Extraocular movements intact.     Conjunctiva/sclera: Conjunctivae normal.     Pupils: Pupils are equal, round, and reactive to light.  Neck:     Thyroid: No thyroid mass or thyromegaly.  Cardiovascular:     Rate and Rhythm: Normal rate and regular rhythm.     Pulses: Normal pulses.          Radial pulses are 2+ on the right side and 2+ on the left side.     Heart sounds: Normal heart sounds. No murmur heard. Pulmonary:     Effort: Pulmonary effort is normal. No respiratory distress.     Breath sounds: Normal breath sounds. No wheezing, rhonchi or rales.  Abdominal:     General: Bowel sounds are normal. There is no distension.     Palpations: Abdomen is soft. There is no mass.     Tenderness: There is no abdominal tenderness. There is no guarding or rebound.     Hernia: No hernia is present.  Musculoskeletal:        General: Normal range of motion.     Cervical back: Normal range of motion  and neck supple.     Right lower leg: No edema.     Left lower leg: No edema.  Lymphadenopathy:     Cervical: No cervical adenopathy.  Skin:    General: Skin is warm and dry.     Findings: No rash.  Neurological:     General: No focal deficit present.     Mental Status: He is alert and oriented to person, place, and time.  Psychiatric:        Mood and Affect: Mood normal.        Behavior: Behavior normal.        Thought Content: Thought content normal.  Judgment: Judgment normal.       Results for orders placed or performed in visit on 05/05/22  Lipid panel  Result Value Ref Range   Cholesterol, Total 154 100 - 199 mg/dL   Triglycerides 789 (H) 0 - 149 mg/dL   HDL 36 (L) >38 mg/dL   VLDL Cholesterol Cal 27 5 - 40 mg/dL   LDL Chol Calc (NIH) 91 0 - 99 mg/dL   Chol/HDL Ratio 4.3 0.0 - 5.0 ratio  Comprehensive metabolic panel  Result Value Ref Range   Glucose 109 (H) 70 - 99 mg/dL   BUN 21 6 - 24 mg/dL   Creatinine, Ser 1.01 0.76 - 1.27 mg/dL   eGFR 76 >75 ZW/CHE/5.27   BUN/Creatinine Ratio 18 9 - 20   Sodium 142 134 - 144 mmol/L   Potassium 5.2 3.5 - 5.2 mmol/L   Chloride 102 96 - 106 mmol/L   CO2 24 20 - 29 mmol/L   Calcium 9.5 8.7 - 10.2 mg/dL   Total Protein 7.4 6.0 - 8.5 g/dL   Albumin 4.5 3.8 - 4.9 g/dL   Globulin, Total 2.9 1.5 - 4.5 g/dL   Albumin/Globulin Ratio 1.6 1.2 - 2.2   Bilirubin Total 0.4 0.0 - 1.2 mg/dL   Alkaline Phosphatase 103 44 - 121 IU/L   AST 28 0 - 40 IU/L   ALT 37 0 - 44 IU/L  TSH  Result Value Ref Range   TSH 4.070 0.450 - 4.500 uIU/mL  Hemoglobin A1c  Result Value Ref Range   Hgb A1c MFr Bld 5.5 4.8 - 5.6 %   Est. average glucose Bld gHb Est-mCnc 111 mg/dL  PSA  Result Value Ref Range   Prostate Specific Ag, Serum 1.5 0.0 - 4.0 ng/mL  VITAMIN D 25 Hydroxy (Vit-D Deficiency, Fractures)  Result Value Ref Range   Vit D, 25-Hydroxy 42.2 30.0 - 100.0 ng/mL      05/11/2022    8:25 AM 05/05/2021    9:07 AM 07/23/2020    7:34 AM  03/29/2020    8:19 AM 02/07/2019    3:06 PM  Depression screen PHQ 2/9  Decreased Interest 0 0 1 0 0  Down, Depressed, Hopeless 0 0 1 0 0  PHQ - 2 Score 0 0 2 0 0  Altered sleeping  1 1 2 1   Tired, decreased energy  0 2 0 0  Change in appetite  0 1 0 0  Feeling bad or failure about yourself   0 0 0 0  Trouble concentrating  0 3 0 1  Moving slowly or fidgety/restless  0 0 0 0  Suicidal thoughts  0 0 0 0  PHQ-9 Score  1 9 2 2   Difficult doing work/chores   Not difficult at all      Assessment & Plan:   Problem List Items Addressed This Visit     Health maintenance examination - Primary (Chronic)    Preventative protocols reviewed and updated unless pt declined. Discussed healthy diet and lifestyle.       Hypothyroidism    Chronic, TSH trending up. Discussed this. Denies hypothyroid symptoms so no change in levothyroxine dose made.       Relevant Medications   levothyroxine (SYNTHROID) 137 MCG tablet   Attention deficit disorder (ADD) in adult    Chronic, stable period on methylphenidate 27mg  daily. Tolerating well and benefits from this medication to help control ADHD symptoms - will refill.       GERD (gastroesophageal reflux  disease)    Well controlled on daily lose dose PPI - refilled today.       Relevant Medications   omeprazole (PRILOSEC) 20 MG capsule   Dyslipidemia    Chronic, stable off medication. The 10-year ASCVD risk score (Arnett DK, et al., 2019) is: 5.8%   Values used to calculate the score:     Age: 59 years     Sex: Male     Is Non-Hispanic African American: No     Diabetic: No     Tobacco smoker: No     Systolic Blood Pressure: 409 mmHg     Is BP treated: No     HDL Cholesterol: 36 mg/dL     Total Cholesterol: 154 mg/dL       Kidney lesion, native, left    Due for repeat imaging -will order renal protocol MR.       Obesity, Class I, BMI 30.0-34.9 (see actual BMI)    Encouraged healthy diet and lifestyle choices to affect sustainable  weight loss.       NAFLD (nonalcoholic fatty liver disease)    Incidentally noted by imaging previously.       Prediabetes    Latest A1c normal range.      Vitamin D deficiency    He continues vit D 1000 IU daily.       MDD (major depressive disorder), recurrent episode, mild (HCC)    Chronic, stable period on prozac 30mg  daily - continue.       Relevant Medications   FLUoxetine (PROZAC) 10 MG capsule   FLUoxetine (PROZAC) 20 MG capsule   Other Visit Diagnoses     Other specified disorders of kidney and ureter       Relevant Orders   MR Abdomen W Wo Contrast        Meds ordered this encounter  Medications   methylphenidate 27 MG PO CR tablet    Sig: Take 1 tablet (27 mg total) by mouth daily.    Dispense:  31 tablet    Refill:  0   methylphenidate 27 MG PO CR tablet    Sig: Take 1 tablet (27 mg total) by mouth every morning.    Dispense:  31 tablet    Refill:  0   methylphenidate 27 MG PO CR tablet    Sig: Take 1 tablet (27 mg total) by mouth every morning.    Dispense:  30 tablet    Refill:  0   FLUoxetine (PROZAC) 10 MG capsule    Sig: TAKE 1 CAPSULE DAILY (TAKE WITH 20 MG CAPSULE)    Dispense:  90 capsule    Refill:  4   FLUoxetine (PROZAC) 20 MG capsule    Sig: Take 1 capsule (20 mg total) by mouth daily. With 10mg  for total 30mg  dose    Dispense:  90 capsule    Refill:  4   levothyroxine (SYNTHROID) 137 MCG tablet    Sig: Take 1 tablet (137 mcg total) by mouth daily before breakfast.    Dispense:  90 tablet    Refill:  4   omeprazole (PRILOSEC) 20 MG capsule    Sig: Take 1 capsule (20 mg total) by mouth daily.    Dispense:  90 capsule    Refill:  4   Cholecalciferol (VITAMIN D3 ADULT GUMMIES) 25 MCG (1000 UT) CHEW    Sig: Chew 1 tablet (1,000 Units total) by mouth daily.    Orders Placed This Encounter  Procedures  MR Abdomen W Wo Contrast    Standing Status:   Future    Standing Expiration Date:   05/12/2023    Order Specific Question:   If  indicated for the ordered procedure, I authorize the administration of contrast media per Radiology protocol    Answer:   Yes    Order Specific Question:   What is the patient's sedation requirement?    Answer:   No Sedation    Order Specific Question:   Does the patient have a pacemaker or implanted devices?    Answer:   No    Order Specific Question:   Preferred imaging location?    Answer:   [facility] (table limit-350lbs)    Patient Instructions  You are doing well today Work on regular exercise routine.  Return as needed or in 1 year for next physical.   Follow up plan: Return in about 1 year (around 05/12/2023) for annual exam, prior fasting for blood work.  Ria Bush, MD

## 2022-05-11 NOTE — Assessment & Plan Note (Signed)
Latest A1c normal range.  

## 2022-05-11 NOTE — Assessment & Plan Note (Signed)
Incidentally noted by imaging previously.

## 2022-05-11 NOTE — Assessment & Plan Note (Signed)
Encouraged healthy diet and lifestyle choices to affect sustainable weight loss.  ?

## 2022-05-11 NOTE — Assessment & Plan Note (Signed)
He continues vit D 1000 IU daily.

## 2022-05-14 ENCOUNTER — Encounter: Payer: Self-pay | Admitting: *Deleted

## 2022-06-23 IMAGING — CT CT ABDOMEN WO/W CM
3 of 14 series · 10 of 46 positions shown, 16 images · IV contrast (omnipaque)
Comparison: April 26, 2019

CLINICAL DATA: Renal cyst, Bosniak II F renal cyst follow-up
evaluation in this 54-year-old male.

EXAM:
CT ABDOMEN WITHOUT AND WITH CONTRAST
TECHNIQUE: Multidetector CT imaging of the abdomen was performed following the
standard protocol before and following the bolus administration of
intravenous contrast.
CONTRAST:  125mL OMNIPAQUE IOHEXOL 300 MG/ML  SOLN

[Series 2: axial without renal without 2.00 · axial · non-contrast · 0.96mm/px · z∈[-1332,-1158]mm · 4 of 147 slices shown, 9 images]
[im 30/147  soft-tissue]
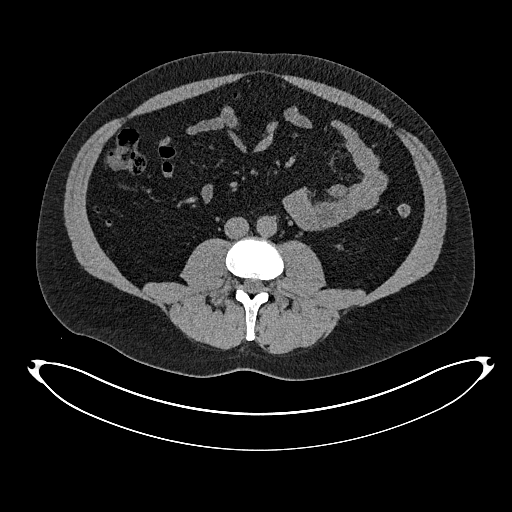
[im 30/147  lung]
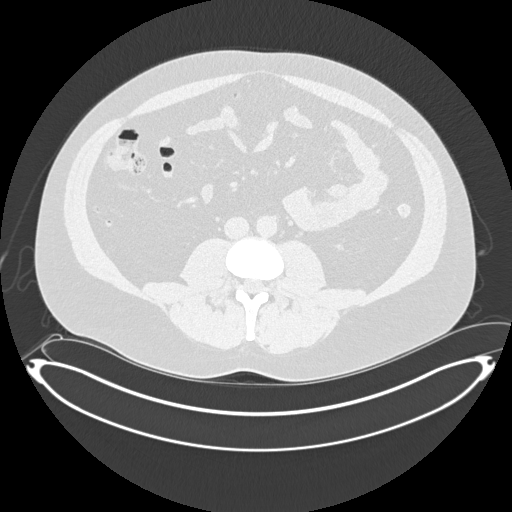
[im 30/147  bone]
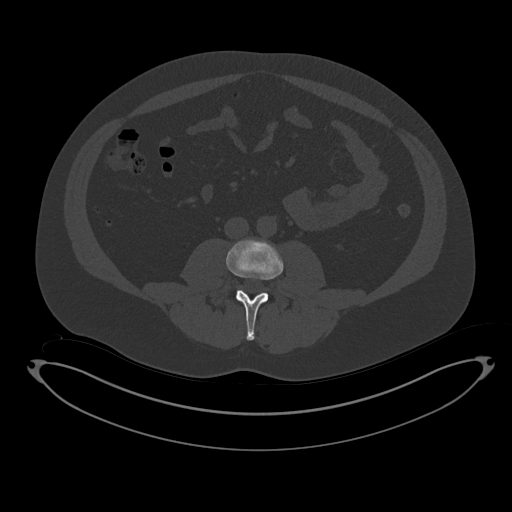
[im 59/147  soft-tissue]
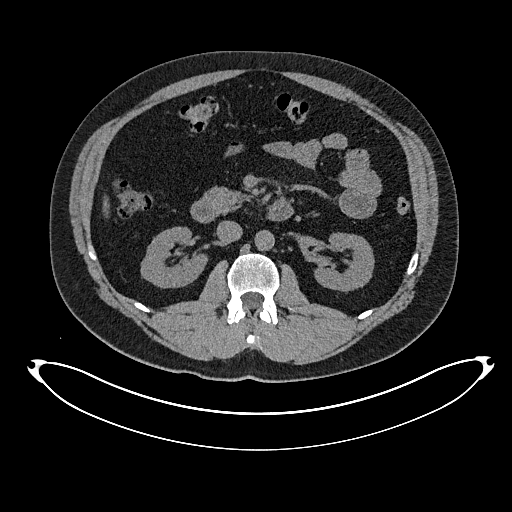
[im 59/147  lung]
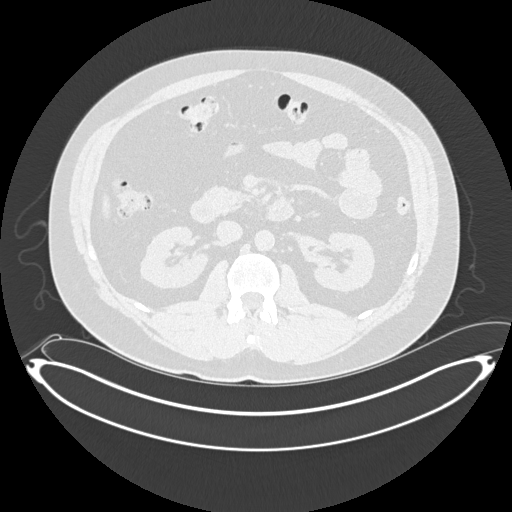
[im 88/147  soft-tissue]
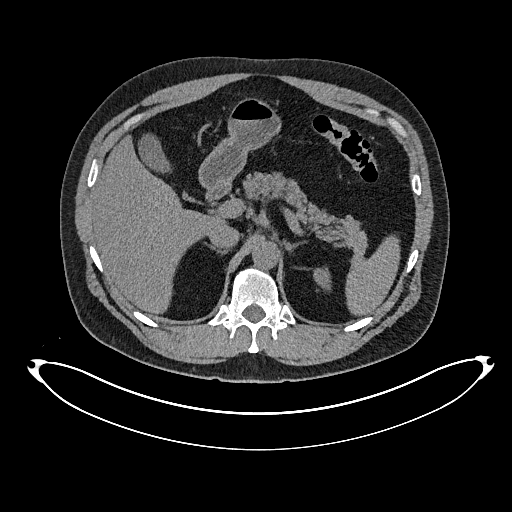
[im 88/147  lung]
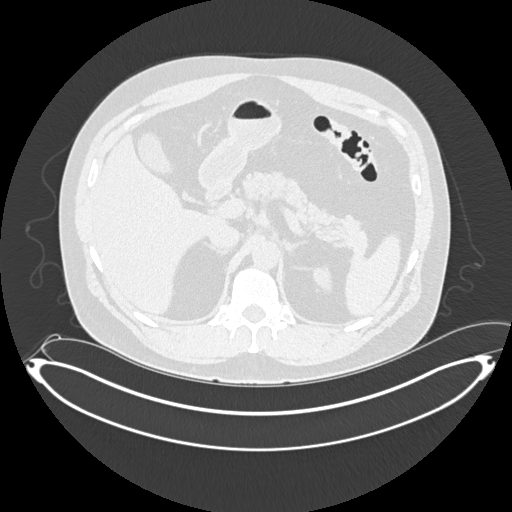
[im 117/147  soft-tissue]
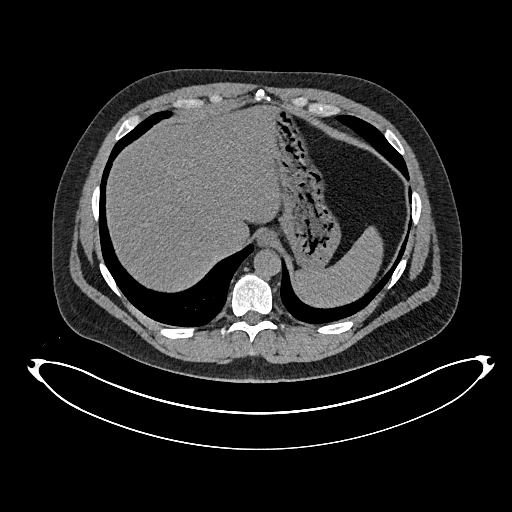
[im 117/147  lung]
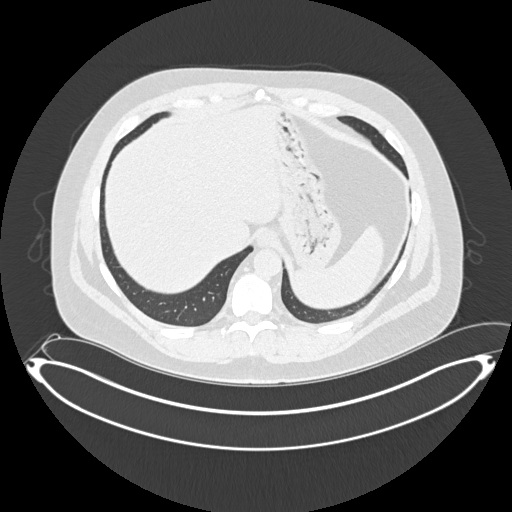

[Series 4: coronal without renal without 2.00 cor · coronal · non-contrast · 0.57mm/px · 2 of 162 slices shown, 3 images]
[im 54/162  soft-tissue]
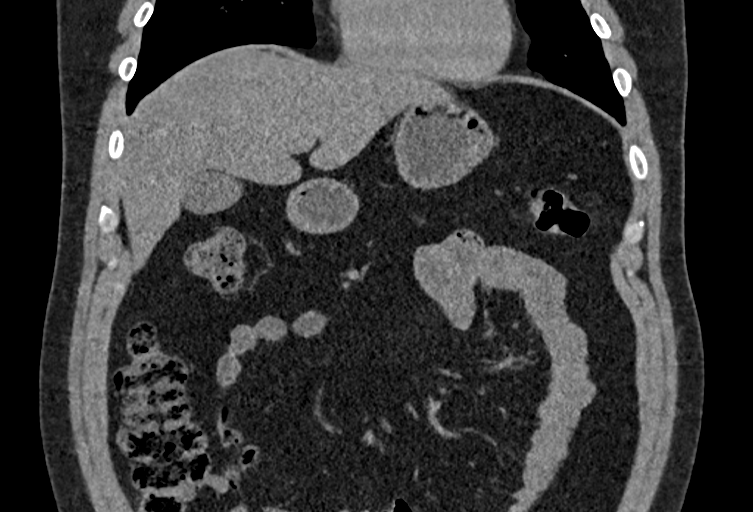
[im 54/162  bone]
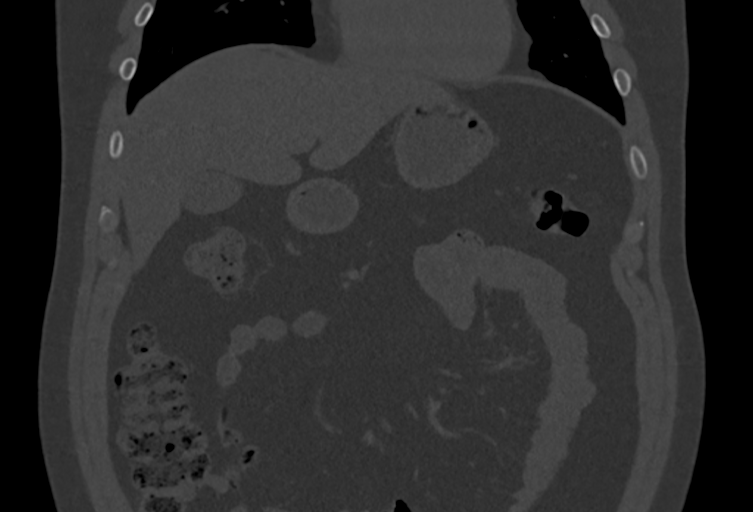
[im 108/162  soft-tissue]
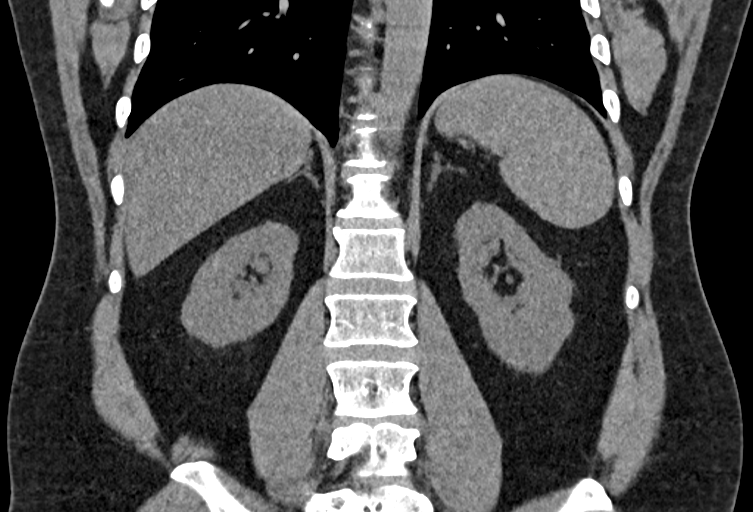

[Series 8: axial arterial renal arterial 2.00 · axial · arterial · 0.96mm/px · z∈[-1332,-1158]mm · 4 of 147 slices shown]
[im 30/147  soft-tissue]
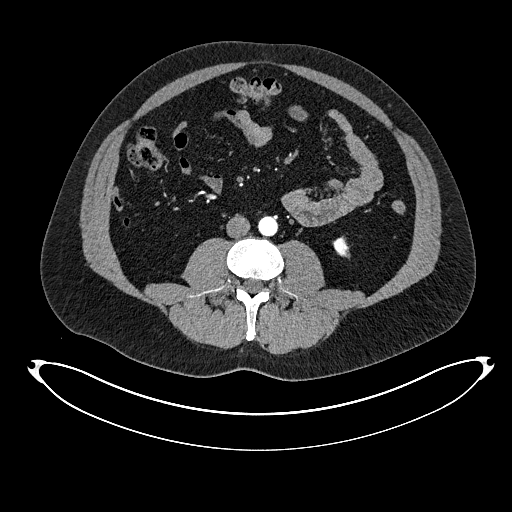
[im 59/147  soft-tissue]
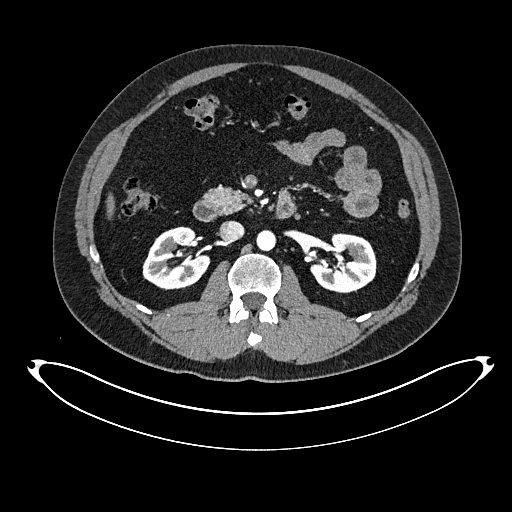
[im 88/147  soft-tissue]
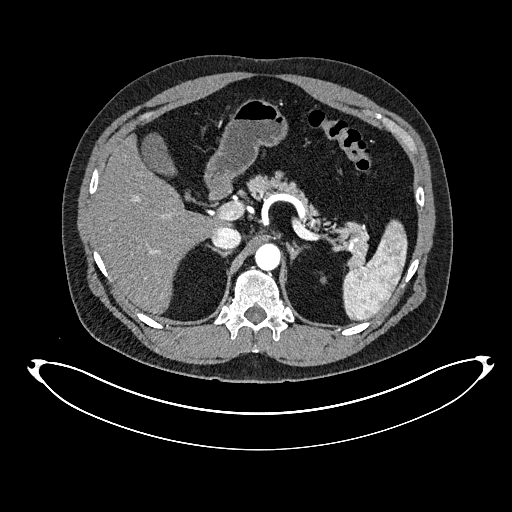
[im 117/147  soft-tissue]
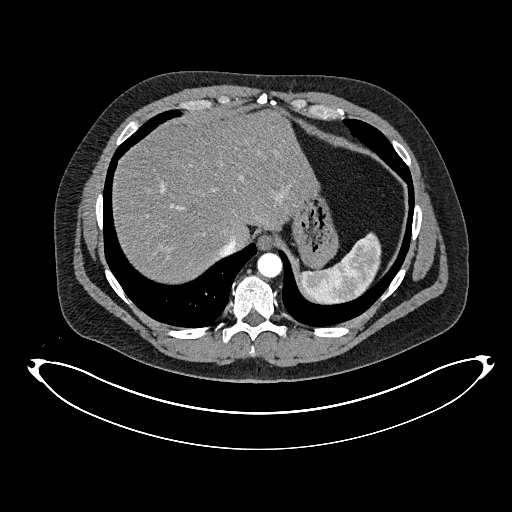

[10 of 46 positions shown; findings below may reference images not displayed]

FINDINGS: Lower chest: Lung bases with mild LEFT basilar atelectasis.
Granuloma in the LEFT lung base unchanged from previous imaging.

Hepatobiliary: Hepatic steatosis. No focal, suspicious hepatic
lesion. No pericholecystic stranding.

Pancreas: Normal

Spleen: Normal

Adrenals/Urinary Tract: Normal adrenal glands.

1.9 x 1.8 cm cystic lesion in the lower pole of the LEFT kidney
without internal septation and with small calcification along the
periphery. Internal density measured just below 30 Hounsfield units
in most areas slightly above 30 Hounsfield units on nephrogram phase
in the superior margin dependent calcium is also noted, no
appreciable change from baseline noncontrast images are noted.
Suggestion of septations, better visualized on previous MRI.

RIGHT kidney without signs of focal, suspicious lesion.

No sign of hydronephrosis. No nephrolithiasis. Symmetric renal
enhancement.

Stomach/Bowel: Stomach under distended limiting assessment. No acute
gastrointestinal process. Scattered colonic diverticulosis.

Vascular/Lymphatic: Normal caliber abdominal aorta There is no
gastrohepatic or hepatoduodenal ligament lymphadenopathy. No
retroperitoneal or mesenteric lymphadenopathy.

Other: No ascites.

Musculoskeletal: No acute musculoskeletal process. Spinal
degenerative changes.
IMPRESSION: 1. Bosniak II F lesion in the lower pole LEFT kidney. This shows no
significant change compared to previous imaging. Given cystic
features would suggest MRI for comparison purposes to baseline
imaging acquired in 4948. This could be performed in 12 months based
on current guidelines.
2. Hepatic steatosis.
3. Scattered colonic diverticulosis.

## 2022-08-18 ENCOUNTER — Other Ambulatory Visit: Payer: Self-pay | Admitting: Family Medicine

## 2022-08-18 DIAGNOSIS — F988 Other specified behavioral and emotional disorders with onset usually occurring in childhood and adolescence: Secondary | ICD-10-CM

## 2022-08-18 MED ORDER — METHYLPHENIDATE HCL ER (OSM) 27 MG PO TBCR: 27.0000 mg | EXTENDED_RELEASE_TABLET | Freq: Every day | ORAL | 0 refills | Status: DC

## 2022-08-18 NOTE — Telephone Encounter (Signed)
ERx 

## 2022-08-18 NOTE — Telephone Encounter (Signed)
Name of Medication: Methylphenidate CR 27 mg Name of Pharmacy: [facility] Last Fill or Written Date and Quantity: 07/13/22, #31 Last Office Visit and Type: 05/11/22, CPE Next Office Visit and Type:  05/14/23, CPE Last Controlled Substance Agreement Date: 05/24/17 Last UDS: 02/07/19

## 2022-08-28 ENCOUNTER — Encounter: Payer: Self-pay | Admitting: Family Medicine

## 2022-08-28 DIAGNOSIS — F988 Other specified behavioral and emotional disorders with onset usually occurring in childhood and adolescence: Secondary | ICD-10-CM

## 2022-08-28 MED ORDER — METHYLPHENIDATE HCL ER (OSM) 27 MG PO TBCR: 27.0000 mg | EXTENDED_RELEASE_TABLET | Freq: Every day | ORAL | 0 refills | Status: DC

## 2022-08-28 NOTE — Telephone Encounter (Signed)
Notified pt via My Chart.

## 2022-08-28 NOTE — Telephone Encounter (Signed)
ERx plz notify patient 

## 2022-09-28 ENCOUNTER — Encounter: Payer: Self-pay | Admitting: Family Medicine

## 2022-09-28 DIAGNOSIS — F988 Other specified behavioral and emotional disorders with onset usually occurring in childhood and adolescence: Secondary | ICD-10-CM

## 2022-09-28 NOTE — Telephone Encounter (Signed)
Name of Medication: Methylphenidate CR 27 mg Name of Pharmacy: [facility] Last Fill or Written Date and Quantity: 08/28/22, #31 Last Office Visit and Type: 05/11/22, CPE Next Office Visit and Type:  05/14/23, CPE Last Controlled Substance Agreement Date: 05/24/17 Last UDS: 02/07/19

## 2022-09-29 ENCOUNTER — Other Ambulatory Visit: Payer: Self-pay | Admitting: Family Medicine

## 2022-09-29 MED ORDER — METHYLPHENIDATE HCL ER (OSM) 27 MG PO TBCR: 27.0000 mg | EXTENDED_RELEASE_TABLET | ORAL | 0 refills | Status: DC

## 2022-09-29 NOTE — Telephone Encounter (Signed)
See Rx request ° °

## 2022-09-29 NOTE — Progress Notes (Signed)
ERx 

## 2022-10-29 NOTE — Telephone Encounter (Signed)
Spoke [facility] [address] asking about rx that was sent in. B             .   I will notify pt.

## 2022-12-01 ENCOUNTER — Other Ambulatory Visit: Payer: Self-pay | Admitting: Family Medicine

## 2022-12-01 MED ORDER — METHYLPHENIDATE HCL ER (OSM) 27 MG PO TBCR: 27.0000 mg | EXTENDED_RELEASE_TABLET | ORAL | 0 refills | Status: DC

## 2022-12-01 NOTE — Telephone Encounter (Signed)
ERx 

## 2022-12-01 NOTE — Telephone Encounter (Signed)
Name of Medication: Methylphenidate CR 27 mg Name of Pharmacy: [facility] Last Fill or Written Date and Quantity: 10/29/22, #31 Last Office Visit and Type: 05/11/22, CPE Next Office Visit and Type:  05/14/23, CPE Last Controlled Substance Agreement Date: 05/24/17 Last UDS: 02/07/19

## 2023-01-04 ENCOUNTER — Other Ambulatory Visit: Payer: Self-pay | Admitting: Family Medicine

## 2023-01-04 MED ORDER — METHYLPHENIDATE HCL ER (OSM) 27 MG PO TBCR: 27.0000 mg | EXTENDED_RELEASE_TABLET | ORAL | 0 refills | Status: DC

## 2023-01-04 NOTE — Telephone Encounter (Signed)
ERx 

## 2023-01-12 ENCOUNTER — Ambulatory Visit: Admitting: Dermatology

## 2023-01-12 DIAGNOSIS — Z808 Family history of malignant neoplasm of other organs or systems: Secondary | ICD-10-CM

## 2023-01-12 DIAGNOSIS — W908XXA Exposure to other nonionizing radiation, initial encounter: Secondary | ICD-10-CM

## 2023-01-12 DIAGNOSIS — L821 Other seborrheic keratosis: Secondary | ICD-10-CM | POA: Diagnosis not present

## 2023-01-12 DIAGNOSIS — L820 Inflamed seborrheic keratosis: Secondary | ICD-10-CM | POA: Diagnosis not present

## 2023-01-12 DIAGNOSIS — L720 Epidermal cyst: Secondary | ICD-10-CM

## 2023-01-12 DIAGNOSIS — L578 Other skin changes due to chronic exposure to nonionizing radiation: Secondary | ICD-10-CM | POA: Diagnosis not present

## 2023-01-12 DIAGNOSIS — L729 Follicular cyst of the skin and subcutaneous tissue, unspecified: Secondary | ICD-10-CM

## 2023-01-12 NOTE — Patient Instructions (Signed)
Cryotherapy Aftercare  Wash gently with soap and water everyday.   Apply Vaseline and Band-Aid daily until healed.   Recommend daily broad spectrum sunscreen SPF 30+ to sun-exposed areas, reapply every 2 hours as needed. Call for new or changing lesions.  Staying in the shade or wearing long sleeves, sun glasses (UVA+UVB protection) and wide brim hats (4-inch brim around the entire circumference of the hat) are also recommended for sun protection.    Due to recent changes in healthcare laws, you may see results of your pathology and/or laboratory studies on MyChart before the doctors have had a chance to review them. We understand that in some cases there may be results that are confusing or concerning to you. Please understand that not all results are received at the same time and often the doctors may need to interpret multiple results in order to provide you with the best plan of care or course of treatment. Therefore, we ask that you please give [place] 2 business days to thoroughly review all your results before contacting the office for clarification. Should we see a critical lab result, you will be contacted sooner.   If You Need Anything After Your Visit  If you have any questions or concerns for your doctor, please call our main line at [phone] and press option 4 to reach your doctor's medical assistant. If no one answers, please leave a voicemail as directed and we will return your call as soon as possible. Messages left after 4 pm will be answered the following business day.   You may also send [place] a message via MyChart. We typically respond to MyChart messages within 1-2 business days.  For prescription refills, please ask your pharmacy to contact our office. Our fax number is [phone].  If you have an urgent issue when the clinic is closed that cannot wait until the next business day, you can page your doctor at the number below.    Please note that while we do our best to be  available for urgent issues outside of office hours, we are not available 24/7.   If you have an urgent issue and are unable to reach [place], you may choose to seek medical care at your doctor's office, retail clinic, urgent care center, or emergency room.  If you have a medical emergency, please immediately call 911 or go to the emergency department.  Pager Numbers  - Dr. Gwen Pounds: [phone]  - Dr. Roseanne Reno: [phone]  - Dr. Katrinka Blazing: [phone]   In the event of inclement weather, please call our main line at [phone] for an update on the status of any delays or closures.  Dermatology Medication Tips: Please keep the boxes that topical medications come in in order to help keep track of the instructions about where and how to use these. Pharmacies typically print the medication instructions only on the boxes and not directly on the medication tubes.   If your medication is too expensive, please contact our office at [phone] option 4 or send [place] a message through MyChart.   We are unable to tell what your co-pay for medications will be in advance as this is different depending on your insurance coverage. However, we may be able to find a substitute medication at lower cost or fill out paperwork to get insurance to cover a needed medication.   If a prior authorization is required to get your medication covered by your insurance company, please allow [place] 1-2 business days to complete this process.  Drug prices often vary depending on where the prescription is filled and some pharmacies may offer cheaper prices.  The website www.goodrx.com contains coupons for medications through different pharmacies. The prices here do not account for what the cost may be with help from insurance (it may be cheaper with your insurance), but the website can give you the price if you did not use any insurance.  - You can print the associated coupon and take it with your prescription to the pharmacy.   - You may also stop by our office during regular business hours and pick up a GoodRx coupon card.  - If you need your prescription sent electronically to a different pharmacy, notify our office through [facility] or by phone at [phone] option 4.

## 2023-01-12 NOTE — Progress Notes (Signed)
   New Patient Visit   Subjective  Phillip Gomez is a 57 y.o. male who presents for the following: spot at back, present for 20 years, patient had it frozen about 10 years ago. Itches on occasion.  Spot at left eye, present for several years, has changed and is more pronounced.  Also with spots at b/l upper arms, present for many years without changes. Patient said according to the [employer] they're tattoos but he has no h/o of trauma to area  No personal hx skin cancer. Patients father had skin cancer at nose, type unknown.  The patient has spots, moles and lesions to be evaluated, some may be new or changing and the patient may have concern these could be cancer.   The following portions of the chart were reviewed this encounter and updated as appropriate: medications, allergies, medical history  Review of Systems:  No other skin or systemic complaints except as noted in HPI or Assessment and Plan.  Objective  Well appearing patient in no apparent distress; mood and affect are within normal limits.   A focused examination was performed of the following areas: Face, back, arms  Relevant exam findings are noted in the Assessment and Plan.  left upper eyelid x 1 Erythematous stuck-on, waxy papule     Assessment & Plan   SEBORRHEIC KERATOSIS - Stuck-on, waxy, tan-brown papules and/or plaques - R lower back - Benign-appearing - Discussed benign etiology and prognosis. - Observe - Call for any changes   ACTINIC DAMAGE - chronic, secondary to cumulative UV radiation exposure/sun exposure over time - diffuse scaly erythematous macules with underlying dyspigmentation - Recommend daily broad spectrum sunscreen SPF 30+ to sun-exposed areas, reapply every 2 hours as needed.  - Recommend staying in the shade or wearing long sleeves, sun glasses (UVA+UVB protection) and wide brim hats (4-inch brim around the entire circumference of the hat). - Call for new or changing  lesions.    Inflamed seborrheic keratosis left upper eyelid x 1  Symptomatic, irritating, patient would like treated.  Benign-appearing.  Call clinic for new or changing lesions.   May need additional treatment to clear due to size of lesion     Destruction of lesion - left upper eyelid x 1  Destruction method: cryotherapy   Informed consent: discussed and consent obtained   Lesion destroyed using liquid nitrogen: Yes   Region frozen until ice ball extended beyond lesion: Yes   Outcome: patient tolerated procedure well with no complications   Post-procedure details: wound care instructions given   Additional details:  Prior to procedure, discussed risks of blister formation, small wound, skin dyspigmentation, or rare scar following cryotherapy. Recommend Vaseline ointment to treated areas while healing.    EPIDERMAL INCLUSION CYST vs BLUE NEVUS Exam: firm blue nodule at left upper arm, 8 mm x 5 mm, slightly firm blue thin papule at right upper arm  Benign-appearing. Recommend observation if it is not bothersome. Discussed option of surgical excision to remove it if it is growing, symptomatic, or other changes noted. Please call for new or changing lesions so they can be evaluated.   Return if symptoms worsen or fail to improve.  Anise Salvo, RMA, am acting as scribe for Willeen Niece, MD .   Documentation: I have reviewed the above documentation for accuracy and completeness, and I agree with the above.  Willeen Niece, MD

## 2023-01-28 ENCOUNTER — Other Ambulatory Visit: Payer: Self-pay | Admitting: Family Medicine

## 2023-01-29 NOTE — Telephone Encounter (Signed)
Name of Medication: Methylphenidate CR 27 mg Name of Pharmacy: [facility] Last Fill or Written Date and Quantity: 01/04/23, #31 Last Office Visit and Type: 05/11/22, CPE Next Office Visit and Type:  05/14/23, CPE Last Controlled Substance Agreement Date: 05/24/17 Last UDS: 02/07/19

## 2023-02-02 MED ORDER — METHYLPHENIDATE HCL ER (OSM) 27 MG PO TBCR: 27.0000 mg | EXTENDED_RELEASE_TABLET | ORAL | 0 refills | Status: DC

## 2023-02-02 NOTE — Telephone Encounter (Signed)
ERx 

## 2023-03-07 ENCOUNTER — Other Ambulatory Visit: Payer: Self-pay | Admitting: Family Medicine

## 2023-03-07 DIAGNOSIS — F988 Other specified behavioral and emotional disorders with onset usually occurring in childhood and adolescence: Secondary | ICD-10-CM

## 2023-03-08 NOTE — Telephone Encounter (Signed)
Name of Medication: Methylphenidate CR 27 mg Name of Pharmacy: [facility] [address] Last [place] Ahr or Written Date and Quantity: 02/02/23, #31 Last Office Visit and Type: 05/11/22, CPE Next Office Visit and Type:  05/14/23, CPE Last Controlled Substance Agreement Date: 05/24/17 Last UDS: 02/07/19

## 2023-03-09 MED ORDER — METHYLPHENIDATE HCL ER (OSM) 27 MG PO TBCR
27.0000 mg | EXTENDED_RELEASE_TABLET | ORAL | 0 refills | Status: DC
Start: 2023-03-09 — End: 2023-04-06

## 2023-03-09 NOTE — Telephone Encounter (Signed)
ERx 

## 2023-04-06 ENCOUNTER — Other Ambulatory Visit: Payer: Self-pay | Admitting: Family Medicine

## 2023-04-06 DIAGNOSIS — F988 Other specified behavioral and emotional disorders with onset usually occurring in childhood and adolescence: Secondary | ICD-10-CM

## 2023-04-06 NOTE — Telephone Encounter (Signed)
Name of Medication: Methylphenidate CR 27 mg Name of Pharmacy: [facility] [address] Last [place] Ahr or Written Date and Quantity: 03/09/23, #31 Last Office Visit and Type: 05/11/22, CPE Next Office Visit and Type:  05/14/23, CPE Last Controlled Substance Agreement Date: 05/24/17 Last UDS: 02/07/19

## 2023-04-08 MED ORDER — METHYLPHENIDATE HCL ER (OSM) 27 MG PO TBCR
27.0000 mg | EXTENDED_RELEASE_TABLET | ORAL | 0 refills | Status: DC
Start: 2023-04-08 — End: 2023-05-06

## 2023-04-08 NOTE — Telephone Encounter (Signed)
ERx 

## 2023-05-01 ENCOUNTER — Other Ambulatory Visit: Payer: Self-pay | Admitting: Family Medicine

## 2023-05-01 DIAGNOSIS — E785 Hyperlipidemia, unspecified: Secondary | ICD-10-CM

## 2023-05-01 DIAGNOSIS — R7303 Prediabetes: Secondary | ICD-10-CM

## 2023-05-01 DIAGNOSIS — E039 Hypothyroidism, unspecified: Secondary | ICD-10-CM

## 2023-05-01 DIAGNOSIS — Z125 Encounter for screening for malignant neoplasm of prostate: Secondary | ICD-10-CM

## 2023-05-01 DIAGNOSIS — E559 Vitamin D deficiency, unspecified: Secondary | ICD-10-CM

## 2023-05-06 ENCOUNTER — Other Ambulatory Visit: Payer: Self-pay | Admitting: Family Medicine

## 2023-05-06 DIAGNOSIS — F988 Other specified behavioral and emotional disorders with onset usually occurring in childhood and adolescence: Secondary | ICD-10-CM

## 2023-05-06 MED ORDER — METHYLPHENIDATE HCL ER (OSM) 27 MG PO TBCR
27.0000 mg | EXTENDED_RELEASE_TABLET | ORAL | 0 refills | Status: DC
Start: 2023-05-06 — End: 2023-05-14

## 2023-05-06 NOTE — Telephone Encounter (Signed)
Refill request for methylphenidate 27 MG PO CR tablet   LOV - 05/11/22 Next OV - 05/14/23 Last refill - 04/08/23 #30/0

## 2023-05-06 NOTE — Telephone Encounter (Signed)
ERx 

## 2023-05-07 ENCOUNTER — Other Ambulatory Visit

## 2023-05-07 ENCOUNTER — Other Ambulatory Visit (INDEPENDENT_AMBULATORY_CARE_PROVIDER_SITE_OTHER)

## 2023-05-07 DIAGNOSIS — E785 Hyperlipidemia, unspecified: Secondary | ICD-10-CM | POA: Diagnosis not present

## 2023-05-07 DIAGNOSIS — E559 Vitamin D deficiency, unspecified: Secondary | ICD-10-CM

## 2023-05-07 DIAGNOSIS — Z125 Encounter for screening for malignant neoplasm of prostate: Secondary | ICD-10-CM

## 2023-05-07 DIAGNOSIS — E039 Hypothyroidism, unspecified: Secondary | ICD-10-CM

## 2023-05-07 DIAGNOSIS — R7303 Prediabetes: Secondary | ICD-10-CM

## 2023-05-07 NOTE — Addendum Note (Signed)
Addended by: Alvina Chou on: 05/07/2023 12:30 PM   Modules accepted: Orders

## 2023-05-08 LAB — COMPREHENSIVE METABOLIC PANEL
ALT: 30 [IU]/L (ref 0–44)
AST: 25 [IU]/L (ref 0–40)
Albumin: 4.3 g/dL (ref 3.8–4.9)
Alkaline Phosphatase: 108 [IU]/L (ref 44–121)
BUN/Creatinine Ratio: 17 (ref 9–20)
BUN: 19 mg/dL (ref 6–24)
Bilirubin Total: 0.4 mg/dL (ref 0.0–1.2)
CO2: 26 mmol/L (ref 20–29)
Calcium: 9.7 mg/dL (ref 8.7–10.2)
Chloride: 100 mmol/L (ref 96–106)
Creatinine, Ser: 1.15 mg/dL (ref 0.76–1.27)
Globulin, Total: 3.1 g/dL (ref 1.5–4.5)
Glucose: 86 mg/dL (ref 70–99)
Potassium: 5.1 mmol/L (ref 3.5–5.2)
Sodium: 139 mmol/L (ref 134–144)
Total Protein: 7.4 g/dL (ref 6.0–8.5)
eGFR: 75 mL/min/{1.73_m2} (ref >59)

## 2023-05-08 LAB — TSH: TSH: 1.95 u[IU]/mL (ref 0.450–4.500)

## 2023-05-08 LAB — HEMOGLOBIN A1C
Est. average glucose Bld gHb Est-mCnc: 114 mg/dL
Hgb A1c MFr Bld: 5.6 % (ref 4.8–5.6)

## 2023-05-08 LAB — LIPID PANEL
Chol/HDL Ratio: 4.1 {ratio} (ref 0.0–5.0)
Cholesterol, Total: 139 mg/dL (ref 100–199)
HDL: 34 mg/dL — ABNORMAL LOW (ref >39)
LDL Chol Calc (NIH): 71 mg/dL (ref 0–99)
Triglycerides: 200 mg/dL — ABNORMAL HIGH (ref 0–149)
VLDL Cholesterol Cal: 34 mg/dL (ref 5–40)

## 2023-05-08 LAB — PSA: Prostate Specific Ag, Serum: 1.4 ng/mL (ref 0.0–4.0)

## 2023-05-08 LAB — VITAMIN D 25 HYDROXY (VIT D DEFICIENCY, FRACTURES): Vit D, 25-Hydroxy: 42.4 ng/mL (ref 30.0–100.0)

## 2023-05-14 ENCOUNTER — Ambulatory Visit: Admitting: Family Medicine

## 2023-05-14 ENCOUNTER — Encounter: Payer: Self-pay | Admitting: Family Medicine

## 2023-05-14 VITALS — BP 128/86 | HR 69 | Temp 98.0°F | Ht 72.25 in | Wt 252.4 lb

## 2023-05-14 DIAGNOSIS — E559 Vitamin D deficiency, unspecified: Secondary | ICD-10-CM

## 2023-05-14 DIAGNOSIS — E785 Hyperlipidemia, unspecified: Secondary | ICD-10-CM

## 2023-05-14 DIAGNOSIS — K219 Gastro-esophageal reflux disease without esophagitis: Secondary | ICD-10-CM

## 2023-05-14 DIAGNOSIS — Z0000 Encounter for general adult medical examination without abnormal findings: Secondary | ICD-10-CM | POA: Diagnosis not present

## 2023-05-14 DIAGNOSIS — N2889 Other specified disorders of kidney and ureter: Secondary | ICD-10-CM | POA: Diagnosis not present

## 2023-05-14 DIAGNOSIS — E039 Hypothyroidism, unspecified: Secondary | ICD-10-CM | POA: Diagnosis not present

## 2023-05-14 DIAGNOSIS — F330 Major depressive disorder, recurrent, mild: Secondary | ICD-10-CM

## 2023-05-14 DIAGNOSIS — N289 Disorder of kidney and ureter, unspecified: Secondary | ICD-10-CM

## 2023-05-14 DIAGNOSIS — E66811 Obesity, class 1: Secondary | ICD-10-CM

## 2023-05-14 DIAGNOSIS — E291 Testicular hypofunction: Secondary | ICD-10-CM | POA: Diagnosis not present

## 2023-05-14 DIAGNOSIS — K760 Fatty (change of) liver, not elsewhere classified: Secondary | ICD-10-CM

## 2023-05-14 DIAGNOSIS — R7303 Prediabetes: Secondary | ICD-10-CM

## 2023-05-14 DIAGNOSIS — F988 Other specified behavioral and emotional disorders with onset usually occurring in childhood and adolescence: Secondary | ICD-10-CM

## 2023-05-14 DIAGNOSIS — F419 Anxiety disorder, unspecified: Secondary | ICD-10-CM

## 2023-05-14 LAB — POC URINALSYSI DIPSTICK (AUTOMATED)
Bilirubin, UA: NEGATIVE
Blood, UA: NEGATIVE
Glucose, UA: NEGATIVE
Ketones, UA: NEGATIVE
Leukocytes, UA: NEGATIVE
Nitrite, UA: NEGATIVE
Protein, UA: NEGATIVE
Spec Grav, UA: 1.015 (ref 1.010–1.025)
Urobilinogen, UA: 0.2 U/dL
pH, UA: 7 (ref 5.0–8.0)

## 2023-05-14 LAB — TESTOSTERONE: Testosterone: 199.11 ng/dL — ABNORMAL LOW (ref 300.00–890.00)

## 2023-05-14 MED ORDER — OMEPRAZOLE 20 MG PO CPDR: 20.0000 mg | DELAYED_RELEASE_CAPSULE | Freq: Every day | ORAL | 4 refills | Status: AC

## 2023-05-14 MED ORDER — FLUOXETINE HCL 10 MG PO CAPS: ORAL_CAPSULE | ORAL | 4 refills | Status: AC

## 2023-05-14 MED ORDER — FLUOXETINE HCL 20 MG PO CAPS: 20.0000 mg | ORAL_CAPSULE | Freq: Every day | ORAL | 4 refills | Status: AC

## 2023-05-14 MED ORDER — METHYLPHENIDATE HCL ER (OSM) 27 MG PO TBCR: 27.0000 mg | EXTENDED_RELEASE_TABLET | ORAL | 0 refills | Status: DC

## 2023-05-14 MED ORDER — LEVOTHYROXINE SODIUM 137 MCG PO TABS: 137.0000 ug | ORAL_TABLET | Freq: Every day | ORAL | 4 refills | Status: AC

## 2023-05-14 NOTE — Progress Notes (Signed)
Ph: [phone] Fax: [phone]   Patient ID: Phillip Gomez, male    DOB: 1965/11/11, 58 y.o.   MRN: [id number]  This visit was conducted in person.  BP 128/86   Pulse 69   Temp 98 F (36.7 C) (Oral)   Ht 6' 0.25" (1.835 m)   Wt 252 lb 6 oz (114.5 kg)   SpO2 95%   BMI 33.99 kg/m    CC: CPE Subjective:   HPI: Phillip Gomez is a 58 y.o. male presenting on 05/14/2023 for Annual Exam   Sees [facility] [place].  Continued weight gain noted. Previously saw healthy weight and wellness visit. Wife had successful gastric bypass. More sedentary.  Has started cutting down portion sizes, cutting down on carbs (cookies, cakes). Planning to start exercise routine with resistance training.    ADD - tolerating methylphenidate 27mg  well. Denies headache, loss of appetite, or chest pain. Does not do stimulant holidays. Finds it really helps energy level.   Notes intermittent sleep initiation insomnia, sleep latency up to 2 hours. Notes relation to financial stress. Managed with melatonin with some effect. Good bedtime routine.   Notes decreased libido over the past 1 year. Chronic mild depressed mood/anxiety. No fatigue.   Preventative: Colonoscopy 02/2017 - 3 polyps, rpt 5 yrs (through [place])  Prostate cancer screening - fmhx prostate cancer (brother deceased age 28), continue yearly screen. No prostate symptoms. Nocturia x2-3 Lung cancer screening - not eligible  Flu shot yearly  COVID vaccine Pfizer 06/2019, 07/2019, booster 12/2019, 12/2021  Prevnar-20 12/2021 Tdap 2014, 11/2017, 11/2018 Shingrix - 03/2020, 06/2020  Seat belt use discussed Sunscreen use discussed. No changing moles on skin.  Sleep - averaging 8 hours/night  Non smoker  Alcohol - rare  Dentist q6 mo Eye exam yearly  Bowel -no constipation   Lives with wife, BIL, occasionally step son Occupation: owns business - Air cabin crew. Retired from [employer] - sees [place] Activity: to start walking, resistance  training Diet: good water, good fruits/vegetables      Relevant past medical, surgical, family and social history reviewed and updated as indicated. Interim medical history since our last visit reviewed. Allergies and medications reviewed and updated. Outpatient Medications Prior to Visit  Medication Sig Dispense Refill   FLUoxetine (PROZAC) 10 MG capsule TAKE 1 CAPSULE DAILY (TAKE WITH 20 MG CAPSULE) 90 capsule 4   FLUoxetine (PROZAC) 20 MG capsule Take 1 capsule (20 mg total) by mouth daily. With 10mg  for total 30mg  dose 90 capsule 4   levothyroxine (SYNTHROID) 137 MCG tablet Take 1 tablet (137 mcg total) by mouth daily before breakfast. 90 tablet 4   methylphenidate 27 MG PO CR tablet Take 1 tablet (27 mg total) by mouth every morning. 31 tablet 0   omeprazole (PRILOSEC) 20 MG capsule Take 1 capsule (20 mg total) by mouth daily. 90 capsule 4   Cholecalciferol (VITAMIN D3 ADULT GUMMIES) 25 MCG (1000 UT) CHEW Chew 1 tablet (1,000 Units total) by mouth daily.     methylphenidate 27 MG PO CR tablet Take 1 tablet (27 mg total) by mouth daily. 31 tablet 0   methylphenidate 27 MG PO CR tablet Take 1 tablet (27 mg total) by mouth every morning. 30 tablet 0   No facility-administered medications prior to visit.     Per HPI unless specifically indicated in ROS section below Review of Systems  Constitutional:  Negative for activity change, appetite change, chills, fatigue, fever and unexpected weight change.  HENT:  Negative  for hearing loss.   Eyes:  Negative for visual disturbance.  Respiratory:  Negative for cough, chest tightness, shortness of breath and wheezing.   Cardiovascular:  Negative for chest pain, palpitations and leg swelling.  Gastrointestinal:  Negative for abdominal distention, abdominal pain, blood in stool, constipation, diarrhea, nausea and vomiting.  Genitourinary:  Negative for difficulty urinating and hematuria.  Musculoskeletal:  Negative for arthralgias, myalgias and  neck pain.  Skin:  Negative for rash.  Neurological:  Negative for dizziness, seizures, syncope and headaches.  Hematological:  Negative for adenopathy. Does not bruise/bleed easily.  Psychiatric/Behavioral:  Negative for dysphoric mood. The patient is not nervous/anxious.     Objective:  BP 128/86   Pulse 69   Temp 98 F (36.7 C) (Oral)   Ht 6' 0.25" (1.835 m)   Wt 252 lb 6 oz (114.5 kg)   SpO2 95%   BMI 33.99 kg/m   Wt Readings from Last 3 Encounters:  05/14/23 252 lb 6 oz (114.5 kg)  05/11/22 245 lb 4 oz (111.2 kg)  05/05/21 226 lb 3 oz (102.6 kg)      Physical Exam Vitals and nursing note reviewed.  Constitutional:      General: He is not in acute distress.    Appearance: Normal appearance. He is well-developed. He is not ill-appearing.  HENT:     Head: Normocephalic and atraumatic.     Right Ear: Hearing, tympanic membrane, ear canal and external ear normal.     Left Ear: Hearing, tympanic membrane, ear canal and external ear normal.     Mouth/Throat:     Mouth: Mucous membranes are moist.     Pharynx: Oropharynx is clear. No oropharyngeal exudate or posterior oropharyngeal erythema.  Eyes:     General: No scleral icterus.    Extraocular Movements: Extraocular movements intact.     Conjunctiva/sclera: Conjunctivae normal.     Pupils: Pupils are equal, round, and reactive to light.  Neck:     Thyroid: No thyroid mass or thyromegaly.  Cardiovascular:     Rate and Rhythm: Normal rate and regular rhythm.     Pulses: Normal pulses.          Radial pulses are 2+ on the right side and 2+ on the left side.     Heart sounds: Normal heart sounds. No murmur heard. Pulmonary:     Effort: Pulmonary effort is normal. No respiratory distress.     Breath sounds: Normal breath sounds. No wheezing, rhonchi or rales.  Abdominal:     General: Bowel sounds are normal. There is no distension.     Palpations: Abdomen is soft. There is no mass.     Tenderness: There is no abdominal  tenderness. There is no guarding or rebound.     Hernia: No hernia is present.  Musculoskeletal:        General: Normal range of motion.     Cervical back: Normal range of motion and neck supple.     Right lower leg: No edema.     Left lower leg: No edema.  Lymphadenopathy:     Cervical: No cervical adenopathy.  Skin:    General: Skin is warm and dry.     Findings: No rash.  Neurological:     General: No focal deficit present.     Mental Status: He is alert and oriented to person, place, and time.  Psychiatric:        Mood and Affect: Mood normal.  Behavior: Behavior normal.        Thought Content: Thought content normal.        Judgment: Judgment normal.       Results for orders placed or performed in visit on 05/07/23  Lipid panel   Collection Time: 05/07/23 12:30 PM  Result Value Ref Range   Cholesterol, Total 139 100 - 199 mg/dL   Triglycerides 161 (H) 0 - 149 mg/dL   HDL 34 (L) >09 mg/dL   VLDL Cholesterol Cal 34 5 - 40 mg/dL   LDL Chol Calc (NIH) 71 0 - 99 mg/dL   Chol/HDL Ratio 4.1 0.0 - 5.0 ratio  Comprehensive metabolic panel   Collection Time: 05/07/23 12:30 PM  Result Value Ref Range   Glucose 86 70 - 99 mg/dL   BUN 19 6 - 24 mg/dL   Creatinine, Ser 6.04 0.76 - 1.27 mg/dL   eGFR 75 >54 UJ/WJX/9.14   BUN/Creatinine Ratio 17 9 - 20   Sodium 139 134 - 144 mmol/L   Potassium 5.1 3.5 - 5.2 mmol/L   Chloride 100 96 - 106 mmol/L   CO2 26 20 - 29 mmol/L   Calcium 9.7 8.7 - 10.2 mg/dL   Total Protein 7.4 6.0 - 8.5 g/dL   Albumin 4.3 3.8 - 4.9 g/dL   Globulin, Total 3.1 1.5 - 4.5 g/dL   Bilirubin Total 0.4 0.0 - 1.2 mg/dL   Alkaline Phosphatase 108 44 - 121 IU/L   AST 25 0 - 40 IU/L   ALT 30 0 - 44 IU/L  Hemoglobin A1c   Collection Time: 05/07/23 12:30 PM  Result Value Ref Range   Hgb A1c MFr Bld 5.6 4.8 - 5.6 %   Est. average glucose Bld gHb Est-mCnc 114 mg/dL  TSH   Collection Time: 05/07/23 12:30 PM  Result Value Ref Range   TSH 1.950 0.450 -  4.500 uIU/mL  VITAMIN D 25 Hydroxy (Vit-D Deficiency, Fractures)   Collection Time: 05/07/23 12:30 PM  Result Value Ref Range   Vit D, 25-Hydroxy 42.4 30.0 - 100.0 ng/mL  PSA   Collection Time: 05/07/23 12:30 PM  Result Value Ref Range   Prostate Specific Ag, Serum 1.4 0.0 - 4.0 ng/mL      05/14/2023    8:07 AM 05/11/2022    8:25 AM 05/05/2021    9:07 AM 07/23/2020    7:34 AM 03/29/2020    8:19 AM  Depression screen PHQ 2/9  Decreased Interest 0 0 0 1 0  Down, Depressed, Hopeless 0 0 0 1 0  PHQ - 2 Score 0 0 0 2 0  Altered sleeping 1  1 1 2   Tired, decreased energy 1  0 2 0  Change in appetite 2  0 1 0  Feeling bad or failure about yourself  1  0 0 0  Trouble concentrating 0  0 3 0  Moving slowly or fidgety/restless 0  0 0 0  Suicidal thoughts 0  0 0 0  PHQ-9 Score 5  1 9 2   Difficult doing work/chores Not difficult at all   Not difficult at all        05/14/2023    8:07 AM 05/05/2021    9:07 AM 03/29/2020    8:19 AM 02/07/2019    3:06 PM  GAD 7 : Generalized Anxiety Score  Nervous, Anxious, on Edge 0 1 0 0  Control/stop worrying 0 1 0 0  Worry too much - different things 0 1 0 1  Trouble relaxing 0 1 0 0  Restless 0 1 0 0  Easily annoyed or irritable 1 1 0 0  Afraid - awful might happen 0 0 0 0  Total GAD 7 Score 1 6 0 1  Anxiety Difficulty Not difficult at all      Assessment & Plan:   Problem List Items Addressed This Visit     Health maintenance examination - Primary (Chronic)   Preventative protocols reviewed and updated unless pt declined. Discussed healthy diet and lifestyle.       Hypothyroidism   Chronic, stable. Continue levothyroxine.       Relevant Medications   levothyroxine (SYNTHROID) 137 MCG tablet   Attention deficit disorder (ADD) in adult   Chronic, stable period on methylphenidate 27mg  daily - continue this.       Relevant Medications   methylphenidate 27 MG PO CR tablet (Start on 06/06/2023)   methylphenidate 27 MG PO CR tablet (Start  on 07/04/2023)   methylphenidate 27 MG PO CR tablet (Start on 08/04/2023)   Anxiety   Stable on prozac       Relevant Medications   FLUoxetine (PROZAC) 10 MG capsule   FLUoxetine (PROZAC) 20 MG capsule   GERD (gastroesophageal reflux disease)   Stable period on omeprazole 20mg  daily - continue.       Relevant Medications   omeprazole (PRILOSEC) 20 MG capsule   Hypogonadism in male   Update testosterone levels.       Relevant Orders   Testosterone   Dyslipidemia   Chronic, reviewed diet choices to improve triglycerides. The 10-year ASCVD risk score (Arnett DK, et al., 2019) is: 5.7%   Values used to calculate the score:     Age: 60 years     Sex: Male     Is Non-Hispanic African American: No     Diabetic: No     Tobacco smoker: No     Systolic Blood Pressure: 128 mmHg     Is BP treated: No     HDL Cholesterol: 34 mg/dL     Total Cholesterol: 139 mg/dL       Kidney lesion, native, left   Update renal MRI - due for imaging.       Relevant Orders   MR Abdomen W Wo Contrast   POCT Urinalysis Dipstick (Automated)   Obesity, Class I, BMI 30.0-34.9 (see actual BMI)   Discussed weight gain noted. Encouraged healthy diet and lifestyle choices to affect sustainable weight loss.       NAFLD (nonalcoholic fatty liver disease)   LFTs stable.       Prediabetes   A1c remains in normal range.       Vitamin D deficiency   Now off vit D replacement, levels remain stable      MDD (major depressive disorder), recurrent episode, mild (HCC)   Chronic, stable on prozac 30mg  daily.       Relevant Medications   FLUoxetine (PROZAC) 10 MG capsule   FLUoxetine (PROZAC) 20 MG capsule   Other Visit Diagnoses       Other specified disorders of kidney and ureter       Relevant Orders   MR Abdomen W Wo Contrast   POCT Urinalysis Dipstick (Automated)        Meds ordered this encounter  Medications   FLUoxetine (PROZAC) 10 MG capsule    Sig: TAKE 1 CAPSULE DAILY (TAKE WITH  20 MG CAPSULE)    Dispense:  90 capsule  Refill:  4   FLUoxetine (PROZAC) 20 MG capsule    Sig: Take 1 capsule (20 mg total) by mouth daily. With 10mg  for total 30mg  dose    Dispense:  90 capsule    Refill:  4   levothyroxine (SYNTHROID) 137 MCG tablet    Sig: Take 1 tablet (137 mcg total) by mouth daily before breakfast.    Dispense:  90 tablet    Refill:  4   omeprazole (PRILOSEC) 20 MG capsule    Sig: Take 1 capsule (20 mg total) by mouth daily.    Dispense:  90 capsule    Refill:  4   methylphenidate 27 MG PO CR tablet    Sig: Take 1 tablet (27 mg total) by mouth every morning.    Dispense:  31 tablet    Refill:  0   methylphenidate 27 MG PO CR tablet    Sig: Take 1 tablet (27 mg total) by mouth every morning.    Dispense:  31 tablet    Refill:  0   methylphenidate 27 MG PO CR tablet    Sig: Take 1 tablet (27 mg total) by mouth every morning.    Dispense:  31 tablet    Refill:  0    Orders Placed This Encounter  Procedures   MR Abdomen W Wo Contrast    Standing Status:   Future    Expiration Date:   05/13/2024    If indicated for the ordered procedure, I authorize the administration of contrast media per Radiology protocol:   Yes    What is the patient's sedation requirement?:   No Sedation    Does the patient have a pacemaker or implanted devices?:   No    Preferred imaging location?:   [facility] (table limit-350lbs)   Testosterone   POCT Urinalysis Dipstick (Automated)    Patient Instructions  [facility].  Let me know if you'd like to get locally [facility])  Testosterone check today  Good to see you today Return as needed or in 1 year for next physical   Follow up plan: No follow-ups on file.  Eustaquio Boyden, MD

## 2023-05-14 NOTE — Assessment & Plan Note (Signed)
Chronic, reviewed diet choices to improve triglycerides. The 10-year ASCVD risk score (Arnett DK, et al., 2019) is: 5.7%   Values used to calculate the score:     Age: 58 years     Sex: Male     Is Non-Hispanic African American: No     Diabetic: No     Tobacco smoker: No     Systolic Blood Pressure: 128 mmHg     Is BP treated: No     HDL Cholesterol: 34 mg/dL     Total Cholesterol: 139 mg/dL

## 2023-05-14 NOTE — Assessment & Plan Note (Signed)
Discussed weight gain noted.  Encouraged healthy diet and lifestyle choices to affect sustainable weight loss.

## 2023-05-14 NOTE — Assessment & Plan Note (Signed)
 Stable on prozac.

## 2023-05-14 NOTE — Assessment & Plan Note (Signed)
A1c remains in normal range.

## 2023-05-14 NOTE — Assessment & Plan Note (Signed)
Chronic, stable on prozac 30mg  daily.

## 2023-05-14 NOTE — Assessment & Plan Note (Signed)
 Preventative protocols reviewed and updated unless pt declined. Discussed healthy diet and lifestyle.

## 2023-05-14 NOTE — Assessment & Plan Note (Signed)
Now off vit D replacement, levels remain stable

## 2023-05-14 NOTE — Assessment & Plan Note (Signed)
Stable period on omeprazole 20mg daily - continue.  ?

## 2023-05-14 NOTE — Assessment & Plan Note (Signed)
Chronic, stable period on methylphenidate 27mg  daily - continue this.

## 2023-05-14 NOTE — Assessment & Plan Note (Signed)
Chronic, stable. Continue levothyroxine. 

## 2023-05-14 NOTE — Assessment & Plan Note (Signed)
Update renal MRI - due for imaging.

## 2023-05-14 NOTE — Assessment & Plan Note (Signed)
Update testosterone levels.  

## 2023-05-14 NOTE — Patient Instructions (Addendum)
[facility]  Let me know if you'd like to get locally [facility])  Testosterone check today  Good to see you today Return as needed or in 1 year for next physical

## 2023-05-14 NOTE — Assessment & Plan Note (Signed)
 LFTs stable.

## 2023-05-17 ENCOUNTER — Encounter: Payer: Self-pay | Admitting: Family Medicine

## 2023-05-17 ENCOUNTER — Other Ambulatory Visit: Payer: Self-pay | Admitting: Family Medicine

## 2023-05-17 DIAGNOSIS — E291 Testicular hypofunction: Secondary | ICD-10-CM

## 2023-05-18 ENCOUNTER — Other Ambulatory Visit (INDEPENDENT_AMBULATORY_CARE_PROVIDER_SITE_OTHER)

## 2023-05-18 DIAGNOSIS — E291 Testicular hypofunction: Secondary | ICD-10-CM

## 2023-05-18 NOTE — Addendum Note (Signed)
Addended by: Alvina Chou on: 05/18/2023 07:37 AM   Modules accepted: Orders

## 2023-05-18 NOTE — Addendum Note (Signed)
Addended by: Alvina Chou on: 05/18/2023 07:27 AM   Modules accepted: Orders

## 2023-05-21 ENCOUNTER — Encounter: Payer: Self-pay | Admitting: Family Medicine

## 2023-05-21 ENCOUNTER — Encounter: Payer: Self-pay | Admitting: *Deleted

## 2023-05-21 ENCOUNTER — Other Ambulatory Visit (HOSPITAL_COMMUNITY): Payer: Self-pay

## 2023-05-21 ENCOUNTER — Ambulatory Visit: Admitting: Family Medicine

## 2023-05-21 ENCOUNTER — Telehealth: Payer: Self-pay | Admitting: Pharmacy Technician

## 2023-05-21 VITALS — BP 124/84 | HR 79 | Temp 98.2°F | Ht 72.5 in | Wt 253.0 lb

## 2023-05-21 DIAGNOSIS — Z860100 Personal history of colon polyps, unspecified: Secondary | ICD-10-CM | POA: Insufficient documentation

## 2023-05-21 DIAGNOSIS — E291 Testicular hypofunction: Secondary | ICD-10-CM

## 2023-05-21 LAB — TESTOSTERONE, FREE, TOTAL, SHBG
Sex Hormone Binding: 28.5 nmol/L (ref 19.3–76.4)
Testosterone, Free: 9.5 pg/mL (ref 7.2–24.0)
Testosterone: 269 ng/dL (ref 264–916)

## 2023-05-21 LAB — CORTISOL-AM, BLOOD: Cortisol - AM: 8.5 ug/dL (ref 6.2–19.4)

## 2023-05-21 LAB — PROLACTIN: Prolactin: 13.2 ng/mL (ref 3.6–25.2)

## 2023-05-21 LAB — IRON,TIBC AND FERRITIN PANEL
Ferritin: 109 ng/mL (ref 30–400)
Iron Saturation: 24 % (ref 15–55)
Iron: 71 ug/dL (ref 38–169)
Total Iron Binding Capacity: 299 ug/dL (ref 250–450)
UIBC: 228 ug/dL (ref 111–343)

## 2023-05-21 LAB — CBC WITH DIFFERENTIAL/PLATELET

## 2023-05-21 MED ORDER — TESTOSTERONE 20.25 MG/ACT (1.62%) TD GEL: 1.0000 | Freq: Every day | TRANSDERMAL | 3 refills | Status: DC

## 2023-05-21 NOTE — Assessment & Plan Note (Addendum)
 Colon polyps on last colonoscopy 2018 through the [place], rec rpt 5 yrs. He desires to establish with local GI.  Will refer to [facility]

## 2023-05-21 NOTE — Progress Notes (Signed)
 Ph: [phone] Fax: [phone]   Patient ID: Phillip Gomez, male    DOB: 1965-06-27, 58 y.o.   MRN: [id number]  This visit was conducted in person.  BP 124/84   Pulse 79   Temp 98.2 F (36.8 C) (Oral)   Ht 6' 0.5 (1.842 m)   Wt 253 lb (114.8 kg)   SpO2 95%   BMI 33.84 kg/m    CC: hypogonadism Subjective:   HPI: Phillip Gomez is a 58 y.o. male presenting on 05/21/2023 for Medical Management of Chronic Issues (Here to review recent lab results. )   Here to discuss low testosterone  levels.  T 199 last week (8:30am blood draw).  Rpt pending.  Further hormonal evaluation normal - prolactin, am cortisol, iron/% sat.   Endorses decreased libido over the past year as well as chronic mild depressed mood. No fatigue.     Known h/o ADHD, hypothyroidism, NAFLD, prediabetes, MDD.      Relevant past medical, surgical, family and social history reviewed and updated as indicated. Interim medical history since our last visit reviewed. Allergies and medications reviewed and updated. Outpatient Medications Prior to Visit  Medication Sig Dispense Refill   FLUoxetine  (PROZAC ) 10 MG capsule TAKE 1 CAPSULE DAILY (TAKE WITH 20 MG CAPSULE) 90 capsule 4   FLUoxetine  (PROZAC ) 20 MG capsule Take 1 capsule (20 mg total) by mouth daily. With 10mg  for total 30mg  dose 90 capsule 4   levothyroxine  (SYNTHROID ) 137 MCG tablet Take 1 tablet (137 mcg total) by mouth daily before breakfast. 90 tablet 4   [START ON 06/06/2023] methylphenidate  27 MG PO CR tablet Take 1 tablet (27 mg total) by mouth every morning. 31 tablet 0   [START ON 07/04/2023] methylphenidate  27 MG PO CR tablet Take 1 tablet (27 mg total) by mouth every morning. 31 tablet 0   [START ON 08/04/2023] methylphenidate  27 MG PO CR tablet Take 1 tablet (27 mg total) by mouth every morning. 31 tablet 0   omeprazole  (PRILOSEC) 20 MG capsule Take 1 capsule (20 mg total) by mouth daily. 90 capsule 4   No facility-administered medications prior to  visit.     Per HPI unless specifically indicated in ROS section below Review of Systems  Objective:  BP 124/84   Pulse 79   Temp 98.2 F (36.8 C) (Oral)   Ht 6' 0.5 (1.842 m)   Wt 253 lb (114.8 kg)   SpO2 95%   BMI 33.84 kg/m   Wt Readings from Last 3 Encounters:  05/21/23 253 lb (114.8 kg)  05/14/23 252 lb 6 oz (114.5 kg)  05/11/22 245 lb 4 oz (111.2 kg)      Physical Exam Vitals and nursing note reviewed.  Constitutional:      Appearance: Normal appearance. He is not ill-appearing.  Neurological:     Mental Status: He is alert.  Psychiatric:        Mood and Affect: Mood normal.        Behavior: Behavior normal.       Results for orders placed or performed in visit on 05/18/23  CBC with Differential/Platelet   Collection Time: 05/18/23  7:32 AM  Result Value Ref Range   WBC CANCELED x10E3/uL   RBC CANCELED    Hemoglobin CANCELED    Hematocrit CANCELED    Platelets CANCELED    Neutrophils CANCELED    Lymphs CANCELED    Monocytes CANCELED    Eos CANCELED    Lymphocytes Absolute CANCELED  EOS (ABSOLUTE) CANCELED    Basophils Absolute CANCELED   Cortisol-am, blood   Collection Time: 05/18/23  7:32 AM  Result Value Ref Range   Cortisol - AM 8.5 6.2 - 19.4 ug/dL  Prolactin   Collection Time: 05/18/23  7:32 AM  Result Value Ref Range   Prolactin 13.2 3.6 - 25.2 ng/mL  Testosterone , Free, Total, SHBG   Collection Time: 05/18/23  7:32 AM  Result Value Ref Range   Sex Hormone Binding 28.5 19.3 - 76.4 nmol/L  Iron, TIBC and Ferritin Panel   Collection Time: 05/18/23  7:32 AM  Result Value Ref Range   Total Iron Binding Capacity 299 250 - 450 ug/dL   UIBC 771 888 - 656 ug/dL   Iron 71 38 - 830 ug/dL   Iron Saturation 24 15 - 55 %   Ferritin 109 30 - 400 ng/mL    Assessment & Plan:   Problem List Items Addressed This Visit     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 Rpt T level pending. CBC clotted - will redraw today.  Reviewed benefits of testosterone  replacement such as increased energy, libido, as well as risks of treatment including but not limited to increased cardiovascular risk, blood clot risk, possible effect on prostate cancer, hepatotoxicity and need to avoid secondary exposure in household members. Reviewed need to monitor labwork including prostate, cholesterol, blood counts, liver. Reviewed routes of administration and preferred topical replacement given it best matches physiologic testosterone  effect.  After discussion he agrees to start topical testosterone  replacement - sent 1.62% gel 20.25mg /act 75gm bottle 1 pump daily to pharmacy.   RTC 1 mo lab visit and 6 mo f/u OV.       Relevant Orders   CBC with Differential/Platelet   Testosterone    CBC with Differential/Platelet   Hepatic function panel   History of colon polyps   Colon polyps on last colonoscopy 2018 through the [facility], rec rpt 5 yrs. He desires to establish with local GI.  Will refer to [facility]      Relevant Orders   Ambulatory referral to Gastroenterology     Meds ordered this encounter  Medications   Testosterone  20.25 MG/ACT (1.62%) GEL    Sig: Place 1 Act onto the skin daily.    Dispense:  75 g    Refill:  3    Orders Placed This Encounter  Procedures   CBC with Differential/Platelet   Testosterone     Standing Status:   Future    Expiration Date:   05/20/2024   CBC with Differential/Platelet    Standing Status:   Future    Expiration Date:   05/20/2024   Hepatic function panel    Standing Status:   Future    Expiration Date:   05/20/2024   Ambulatory referral to Gastroenterology    Referral Priority:   Routine    Referral Type:   Consultation    Referral Reason:   Specialty Services Required    Number of Visits Requested:   1    Patient Instructions  Blood count test today  Start testosterone  cream sent to pharmacy 1 pump daily  (20.25mg /dose).  Return in 1 month for 8am lab visit only to recheck levels.  Return in 6 months for follow up testosterone  visit  Follow up plan: Return in about 6 months (around 11/18/2023) for follow up visit.  Anton Blas, MD

## 2023-05-21 NOTE — Assessment & Plan Note (Addendum)
 Testing consistent with secondary hypogonadism, confirmed with two separate 8am Testosterone  levels. Rpt T level pending. CBC clotted - will redraw today.  Reviewed benefits of testosterone  replacement such as increased energy, libido, as well as risks of treatment including but not limited to increased cardiovascular risk, blood clot risk, possible effect on prostate cancer, hepatotoxicity and need to avoid secondary exposure in household members. Reviewed need to monitor labwork including prostate, cholesterol, blood counts, liver. Reviewed routes of administration and preferred topical replacement given it best matches physiologic testosterone  effect.  After discussion he agrees to start topical testosterone  replacement - sent 1.62% gel 20.25mg /act 75gm bottle 1 pump daily to pharmacy.   RTC 1 mo lab visit and 6 mo f/u OV.

## 2023-05-21 NOTE — Telephone Encounter (Signed)
 Pharmacy Patient Advocate Encounter   Received notification from CoverMyMeds that prior authorization for Testosterone  20.25 MG/ACT(1.62%) gel is required/requested.   Insurance verification completed.   The patient is insured through [employer] .   Per test claim: PA required; PA submitted to above mentioned insurance via CoverMyMeds Key/confirmation #/EOC [id number] Status is pending

## 2023-05-21 NOTE — Patient Instructions (Addendum)
 Blood count test today  Start testosterone  cream sent to pharmacy 1 pump daily (20.25mg /dose).  Return in 1 month for 8am lab visit only to recheck levels.  Return in 6 months for follow up testosterone  visit

## 2023-05-22 ENCOUNTER — Encounter: Payer: Self-pay | Admitting: Family Medicine

## 2023-05-22 LAB — CBC WITH DIFFERENTIAL/PLATELET
Basophils Absolute: 0 10*3/uL (ref 0.0–0.2)
Basos: 1 %
EOS (ABSOLUTE): 0.1 10*3/uL (ref 0.0–0.4)
Eos: 2 %
Hematocrit: 47.6 % (ref 37.5–51.0)
Hemoglobin: 16 g/dL (ref 13.0–17.7)
Immature Grans (Abs): 0 10*3/uL (ref 0.0–0.1)
Immature Granulocytes: 0 %
Lymphocytes Absolute: 1.6 10*3/uL (ref 0.7–3.1)
Lymphs: 26 %
MCH: 30.4 pg (ref 26.6–33.0)
MCHC: 33.6 g/dL (ref 31.5–35.7)
MCV: 91 fL (ref 79–97)
Monocytes Absolute: 0.5 10*3/uL (ref 0.1–0.9)
Monocytes: 9 %
Neutrophils Absolute: 3.8 10*3/uL (ref 1.4–7.0)
Neutrophils: 62 %
Platelets: 217 10*3/uL (ref 150–450)
RBC: 5.26 x10E6/uL (ref 4.14–5.80)
RDW: 12.9 % (ref 11.6–15.4)
WBC: 6 10*3/uL (ref 3.4–10.8)

## 2023-05-24 ENCOUNTER — Other Ambulatory Visit (HOSPITAL_COMMUNITY): Payer: Self-pay

## 2023-05-24 NOTE — Telephone Encounter (Signed)
 Pharmacy Patient Advocate Encounter  Received notification from [employer] that Prior Authorization for Testosterone  20.25 MG/ACT(1.62%) gel has been APPROVED from 04/21/2023 to 05/20/2024. Unable to obtain price due to refill too soon rejection, last fill date 05/22/2023 next available fill date 0302/2025   PA #/Case ID/Reference #: [id number]

## 2023-05-25 ENCOUNTER — Other Ambulatory Visit (HOSPITAL_COMMUNITY): Payer: Self-pay

## 2023-05-27 ENCOUNTER — Other Ambulatory Visit: Payer: Self-pay

## 2023-05-27 ENCOUNTER — Telehealth: Payer: Self-pay

## 2023-05-27 DIAGNOSIS — Z860100 Personal history of colon polyps, unspecified: Secondary | ICD-10-CM

## 2023-05-27 MED ORDER — GOLYTELY 236 G PO SOLR: 4000.0000 mL | Freq: Once | ORAL | 0 refills | Status: AC

## 2023-05-27 NOTE — Telephone Encounter (Signed)
Gastroenterology Pre-Procedure Review  Request Date: 06/17/23 Requesting Physician: Dr. Allegra Lai  PATIENT REVIEW QUESTIONS: The patient responded to the following health history questions as indicated:    1. Are you having any GI issues? no 2. Do you have a personal history of Polyps? yes (last colonoscopy performed 03/12/2017.  ) 3. Do you have a family history of Colon Cancer or Polyps? no 4. Diabetes Mellitus? no 5. Joint replacements in the past 12 months?no 6. Major health problems in the past 3 months?no 7. Any artificial heart valves, MVP, or defibrillator?no    MEDICATIONS & ALLERGIES:    Patient reports the following regarding taking any anticoagulation/antiplatelet therapy:   Plavix, Coumadin, Eliquis, Xarelto, Lovenox, Pradaxa, Brilinta, or Effient? no Aspirin? no  Patient confirms/reports the following medications:  Current Outpatient Medications  Medication Sig Dispense Refill   FLUoxetine (PROZAC) 10 MG capsule TAKE 1 CAPSULE DAILY (TAKE WITH 20 MG CAPSULE) 90 capsule 4   FLUoxetine (PROZAC) 20 MG capsule Take 1 capsule (20 mg total) by mouth daily. With 10mg  for total 30mg  dose 90 capsule 4   levothyroxine (SYNTHROID) 137 MCG tablet Take 1 tablet (137 mcg total) by mouth daily before breakfast. 90 tablet 4   [START ON 06/06/2023] methylphenidate 27 MG PO CR tablet Take 1 tablet (27 mg total) by mouth every morning. 31 tablet 0   [START ON 07/04/2023] methylphenidate 27 MG PO CR tablet Take 1 tablet (27 mg total) by mouth every morning. 31 tablet 0   [START ON 08/04/2023] methylphenidate 27 MG PO CR tablet Take 1 tablet (27 mg total) by mouth every morning. 31 tablet 0   omeprazole (PRILOSEC) 20 MG capsule Take 1 capsule (20 mg total) by mouth daily. 90 capsule 4   Testosterone 20.25 MG/ACT (1.62%) GEL Place 1 Act onto the skin daily. 75 g 3   No current facility-administered medications for this visit.    Patient confirms/reports the following allergies:  Allergies   Allergen Reactions   Other Other (See Comments)    Dust mites, cockroaches, pet dander, wood pollens---(typical allergy symptoms)    No orders of the defined types were placed in this encounter.   AUTHORIZATION INFORMATION Primary Insurance: 1D#: Group #:  Secondary Insurance: 1D#: Group #:  SCHEDULE INFORMATION: Date: 06/17/23 Time: Location: [facility]

## 2023-06-07 ENCOUNTER — Other Ambulatory Visit: Payer: Self-pay | Admitting: Family Medicine

## 2023-06-07 DIAGNOSIS — F988 Other specified behavioral and emotional disorders with onset usually occurring in childhood and adolescence: Secondary | ICD-10-CM

## 2023-06-07 NOTE — Telephone Encounter (Signed)
 Pt has 3 methylphenidate rxs with following fill dates: 06/06/23, #31/0; 07/04/23, #31/0; 08/04/23, [facility] [place].   Spoke with pt relaying info above and recommending pt call and speak with the pharmacy just before each one is due since they're individual rxs and not 1 with refills attached. Also, reminded pt in the future, to always contact the pharmacy first to request refills. Pt verbalizes understanding.   Unpinned request. Closing encounter.

## 2023-06-11 ENCOUNTER — Other Ambulatory Visit: Payer: Self-pay | Admitting: Family Medicine

## 2023-06-11 DIAGNOSIS — F988 Other specified behavioral and emotional disorders with onset usually occurring in childhood and adolescence: Secondary | ICD-10-CM

## 2023-06-14 NOTE — Telephone Encounter (Signed)
 Message from pt via pharmacy:  [facility] not [facility] it in stock. Can you please res[facility] [place]? That is the closest one that shows it in stock.  Thank you

## 2023-06-15 MED ORDER — METHYLPHENIDATE HCL ER (OSM) 27 MG PO TBCR
27.0000 mg | EXTENDED_RELEASE_TABLET | ORAL | 0 refills | Status: DC
Start: 2023-06-15 — End: 2023-11-19

## 2023-06-15 NOTE — Telephone Encounter (Signed)
 ERx

## 2023-06-17 ENCOUNTER — Encounter
Admission: RE | Disposition: A | Discharge status: Home / Self Care | Payer: Self-pay | Source: Home / Self Care | Attending: Gastroenterology

## 2023-06-17 ENCOUNTER — Ambulatory Visit: Admitting: Anesthesiology

## 2023-06-17 ENCOUNTER — Encounter: Payer: Self-pay | Admitting: Gastroenterology

## 2023-06-17 ENCOUNTER — Ambulatory Visit
Admission: RE | Admit: 2023-06-17 | Discharge: 2023-06-17 | Disposition: A | Discharge status: Home / Self Care | Attending: Gastroenterology | Admitting: Gastroenterology

## 2023-06-17 DIAGNOSIS — E039 Hypothyroidism, unspecified: Secondary | ICD-10-CM | POA: Diagnosis not present

## 2023-06-17 DIAGNOSIS — F419 Anxiety disorder, unspecified: Secondary | ICD-10-CM | POA: Diagnosis not present

## 2023-06-17 DIAGNOSIS — F32A Depression, unspecified: Secondary | ICD-10-CM | POA: Insufficient documentation

## 2023-06-17 DIAGNOSIS — F909 Attention-deficit hyperactivity disorder, unspecified type: Secondary | ICD-10-CM | POA: Diagnosis not present

## 2023-06-17 DIAGNOSIS — K5730 Diverticulosis of large intestine without perforation or abscess without bleeding: Secondary | ICD-10-CM | POA: Diagnosis not present

## 2023-06-17 DIAGNOSIS — Z833 Family history of diabetes mellitus: Secondary | ICD-10-CM | POA: Insufficient documentation

## 2023-06-17 DIAGNOSIS — K219 Gastro-esophageal reflux disease without esophagitis: Secondary | ICD-10-CM | POA: Insufficient documentation

## 2023-06-17 DIAGNOSIS — Z79890 Hormone replacement therapy (postmenopausal): Secondary | ICD-10-CM | POA: Insufficient documentation

## 2023-06-17 DIAGNOSIS — Z1211 Encounter for screening for malignant neoplasm of colon: Secondary | ICD-10-CM | POA: Diagnosis present

## 2023-06-17 DIAGNOSIS — Z860100 Personal history of colon polyps, unspecified: Secondary | ICD-10-CM | POA: Insufficient documentation

## 2023-06-17 HISTORY — PX: COLONOSCOPY WITH PROPOFOL: SHX5780

## 2023-06-17 SURGERY — COLONOSCOPY WITH PROPOFOL
Anesthesia: General

## 2023-06-17 MED ORDER — SODIUM CHLORIDE 0.9% FLUSH: 3.0000 mL | Freq: Two times a day (BID) | INTRAVENOUS | Status: DC

## 2023-06-17 MED ORDER — PROPOFOL 500 MG/50ML IV EMUL
INTRAVENOUS | Status: DC | PRN
  Administered 2023-06-17: 150 ug/kg/min via INTRAVENOUS

## 2023-06-17 MED ORDER — STERILE WATER FOR IRRIGATION IR SOLN
Status: DC | PRN
  Administered 2023-06-17: 120 mL
  Administered 2023-06-17: 60 mL
  Administered 2023-06-17: 180 mL

## 2023-06-17 MED ORDER — SODIUM CHLORIDE 0.9% FLUSH: 3.0000 mL | INTRAVENOUS | Status: DC | PRN

## 2023-06-17 MED ORDER — LIDOCAINE HCL (CARDIAC) PF 100 MG/5ML IV SOSY
PREFILLED_SYRINGE | INTRAVENOUS | Status: DC | PRN
  Administered 2023-06-17: 100 mg via INTRAVENOUS

## 2023-06-17 MED ORDER — SODIUM CHLORIDE 0.9 % IV SOLN: INTRAVENOUS | Status: DC

## 2023-06-17 NOTE — Transfer of Care (Signed)
 Immediate Anesthesia Transfer of Care Note  Patient: Phillip Gomez  Procedure(s) Performed: COLONOSCOPY WITH PROPOFOL  Patient Location: Endoscopy Unit  Anesthesia Type:General  Level of Consciousness: awake and patient cooperative  Airway & Oxygen Therapy: Patient Spontanous Breathing  Post-op Assessment: Report given to RN and Post -op Vital signs reviewed and stable  Post vital signs: stable  Last Vitals:  Vitals Value Taken Time  BP    Temp    Pulse    Resp    SpO2      Last Pain:  Vitals:   06/17/23 0716  TempSrc: Temporal  PainSc: 0-No pain         Complications: No notable events documented.

## 2023-06-17 NOTE — H&P (Signed)
 Arlyss Repress, MD [address]  [address]  [place], [place] [address]  Main: [phone]  Fax: [phone] Pager: [phone]  Primary Care Physician:  Eustaquio Boyden, MD Primary Gastroenterologist:  Dr. Arlyss Repress  Pre-Procedure History & Physical: HPI:  Phillip Gomez is a 58 y.o. male is here for an colonoscopy.   Past Medical History:  Diagnosis Date   ADHD (attention deficit hyperactivity disorder) 2005   Anxiety    Corneal laceration of left eye 11/2018   cat scratch - referred to [facility] for emergent repair   Diverticulosis 2018   by CT   GERD (gastroesophageal reflux disease)    with HH   Hepatic steatosis 2018   by CT   History of chicken pox    Hypothyroidism 2012    Past Surgical History:  Procedure Laterality Date   COLONOSCOPY  02/2017   3mm TA sigmoid colon, rpt 5 yrs (at [place])   FL BARIUM SWALLOW ([facility] HX)     HH, GERD   LASIK  1994   TONSILLECTOMY  ~1973   WISDOM TOOTH EXTRACTION  1990    Prior to Admission medications   Medication Sig Start Date End Date Taking? Authorizing Provider  FLUoxetine (PROZAC) 20 MG capsule Take 1 capsule (20 mg total) by mouth daily. With 10mg  for total 30mg  dose 05/14/23  Yes Eustaquio Boyden, MD  levothyroxine (SYNTHROID) 137 MCG tablet Take 1 tablet (137 mcg total) by mouth daily before breakfast. 05/14/23  Yes Eustaquio Boyden, MD  FLUoxetine (PROZAC) 10 MG capsule TAKE 1 CAPSULE DAILY (TAKE WITH 20 MG CAPSULE) 05/14/23   Eustaquio Boyden, MD  methylphenidate 27 MG PO CR tablet Take 1 tablet (27 mg total) by mouth every morning. 07/04/23   Eustaquio Boyden, MD  methylphenidate 27 MG PO CR tablet Take 1 tablet (27 mg total) by mouth every morning. 08/04/23   Eustaquio Boyden, MD  methylphenidate 27 MG PO CR tablet Take 1 tablet (27 mg total) by mouth every morning. 06/15/23   Eustaquio Boyden, MD  omeprazole (PRILOSEC) 20 MG capsule Take 1 capsule (20 mg total) by mouth daily. 05/14/23   Eustaquio Boyden, MD   Testosterone 20.25 MG/ACT (1.62%) GEL Place 1 Act onto the skin daily. 05/21/23   Eustaquio Boyden, MD    Allergies as of 05/27/2023 - Review Complete 05/27/2023  Allergen Reaction Noted   Other Other (See Comments) 04/22/2016    Family History  Problem Relation Age of Onset   CAD Father 59       MI   Heart disease Father    Stroke Father    Cancer Father    Diabetes Brother    Prostate cancer Brother 36   Cancer Sister        leukemia (half-sister)   Hypertension Neg Hx     Social History   Socioeconomic History   Marital status: Married    Spouse name: Toniann Fail   Number of children: Not on file   Years of education: Not on file   Highest education level: Not on file  Occupational History   Occupation: Retired Hotel manager  Tobacco Use   Smoking status: Never   Smokeless tobacco: Never  Substance and Sexual Activity   Alcohol use: No    Alcohol/week: 0.0 standard drinks of alcohol   Drug use: No   Sexual activity: Not on file  Other Topics Concern   Not on file  Social History Narrative   MIL Darlina Guys)   Lives with wife  and FIL, occasionally step son   Occupation: owns business - Air cabin crew.   Retired from [employer]   Activity: stays active at work   Diet: good water, fruits/vegetables some   Social Drivers of Community education officer: Not on Ship broker Insecurity: Not on file  Transportation Needs: Not on file  Physical Activity: Not on file  Stress: Not on file  Social Connections: Not on file  Intimate Partner Violence: Not on file    Review of Systems: See HPI, otherwise negative ROS  Physical Exam: BP (!) 130/91   Pulse 60   Temp (!) 97.4 F (36.3 C) (Temporal)   Resp 17   Ht 6' (1.829 m)   Wt 112.6 kg   SpO2 98%   BMI 33.66 kg/m  General:   Alert,  pleasant and cooperative in NAD Head:  Normocephalic and atraumatic. Neck:  Supple; no masses or thyromegaly. Lungs:  Clear throughout to auscultation.    Heart:   Regular rate and rhythm. Abdomen:  Soft, nontender and nondistended. Normal bowel sounds, without guarding, and without rebound.   Neurologic:  Alert and  oriented x4;  grossly normal neurologically.  Impression/Plan: Devontay Celaya is here for an colonoscopy to be performed for h/o colon polyps  Risks, benefits, limitations, and alternatives regarding  colonoscopy have been reviewed with the patient.  Questions have been answered.  All parties agreeable.   Lannette Donath, MD  06/17/2023, 8:03 AM

## 2023-06-17 NOTE — Anesthesia Preprocedure Evaluation (Signed)
 Anesthesia Evaluation  Patient identified by MRN, date of birth, ID band Patient awake    Reviewed: Allergy & Precautions, H&P , NPO status , Patient's Chart, lab work & pertinent test results, reviewed documented beta blocker date and time   History of Anesthesia Complications Negative for: history of anesthetic complications  Airway Mallampati: III  TM Distance: >3 FB Neck ROM: full    Dental  (+) Dental Advidsory Given, Caps, Teeth Intact   Pulmonary neg pulmonary ROS   Pulmonary exam normal breath sounds clear to auscultation       Cardiovascular Exercise Tolerance: Good negative cardio ROS Normal cardiovascular exam Rhythm:regular Rate:Normal     Neuro/Psych  PSYCHIATRIC DISORDERS Anxiety Depression    negative neurological ROS     GI/Hepatic Neg liver ROS,GERD  ,,  Endo/Other  neg diabetesHypothyroidism    Renal/GU negative Renal ROS  negative genitourinary   Musculoskeletal   Abdominal   Peds  Hematology negative hematology ROS (+)   Anesthesia Other Findings Past Medical History: 2005: ADHD (attention deficit hyperactivity disorder) No date: Anxiety 11/2018: Corneal laceration of left eye     Comment:  cat scratch - referred to [facility] for emergent repair 2018: Diverticulosis     Comment:  by CT No date: GERD (gastroesophageal reflux disease)     Comment:  with HH 2018: Hepatic steatosis     Comment:  by CT No date: History of chicken pox 2012: Hypothyroidism   Reproductive/Obstetrics negative OB ROS                             Anesthesia Physical Anesthesia Plan  ASA: 2  Anesthesia Plan: General   Post-op Pain Management:    Induction: Intravenous  PONV Risk Score and Plan: 2 and Propofol infusion, TIVA and Treatment may vary due to age or medical condition  Airway Management Planned: Natural Airway and Nasal Cannula  Additional Equipment:   Intra-op Plan:    Post-operative Plan:   Informed Consent: I have reviewed the patients History and Physical, chart, labs and discussed the procedure including the risks, benefits and alternatives for the proposed anesthesia with the patient or authorized representative who has indicated his/her understanding and acceptance.     Dental Advisory Given  Plan Discussed with: Anesthesiologist, CRNA and Surgeon  Anesthesia Plan Comments:        Anesthesia Quick Evaluation

## 2023-06-17 NOTE — Anesthesia Postprocedure Evaluation (Signed)
 Anesthesia Post Note  Patient: Phillip Gomez  Procedure(s) Performed: COLONOSCOPY WITH PROPOFOL  Patient location during evaluation: Endoscopy Anesthesia Type: General Level of consciousness: awake and alert Pain management: pain level controlled Vital Signs Assessment: post-procedure vital signs reviewed and stable Respiratory status: spontaneous breathing, nonlabored ventilation, respiratory function stable and patient connected to nasal cannula oxygen Cardiovascular status: blood pressure returned to baseline and stable Postop Assessment: no apparent nausea or vomiting Anesthetic complications: no   There were no known notable events for this encounter.   Last Vitals:  Vitals:   06/17/23 0902 06/17/23 0912  BP: (!) 123/97 (!) 135/95  Pulse: 70 72  Resp: 12 18  Temp:    SpO2: 99% 99%    Last Pain:  Vitals:   06/17/23 0912  TempSrc:   PainSc: 0-No pain                 Lenard Simmer

## 2023-06-17 NOTE — Op Note (Signed)
 [facility] Patient Name: Phillip Gomez Procedure Date: 06/17/2023 8:27 AM MRN: [id number] Account #: [id number] Date of Birth: 11/02/1965 Admit Type: Outpatient Age: 58 Room: [facility] ENDO ROOM 3 Gender: Male Note Status: Finalized Instrument Name: Nelda Marseille [id number] Procedure:             Colonoscopy Indications:           High risk colon cancer surveillance: Personal history                         of colonic polyps Providers:             Toney Reil MD, MD Referring MD:          Toney Reil MD, MD (Referring MD), Eustaquio Boyden (Referring MD) Medicines:             General Anesthesia Complications:         No immediate complications. Estimated blood loss: None. Procedure:             Pre-Anesthesia Assessment:                        - Prior to the procedure, a History and Physical was                         performed, and patient medications and allergies were                         reviewed. The patient is competent. The risks and                         benefits of the procedure and the sedation options and                         risks were discussed with the patient. All questions                         were answered and informed consent was obtained.                         Patient identification and proposed procedure were                         verified by the physician, the nurse, the                         anesthesiologist, the anesthetist and the technician                         in the pre-procedure area in the procedure room in the                         endoscopy suite. Mental Status Examination: alert and                         oriented. Airway Examination: normal oropharyngeal  airway and neck mobility. Respiratory Examination:                         clear to auscultation. CV Examination: normal.                         Prophylactic Antibiotics: The patient does not  require                         prophylactic antibiotics. Prior Anticoagulants: The                         patient has taken no anticoagulant or antiplatelet                         agents. ASA Grade Assessment: II - A patient with mild                         systemic disease. After reviewing the risks and                         benefits, the patient was deemed in satisfactory                         condition to undergo the procedure. The anesthesia                         plan was to use general anesthesia. Immediately prior                         to administration of medications, the patient was                         re-assessed for adequacy to receive sedatives. The                         heart rate, respiratory rate, oxygen saturations,                         blood pressure, adequacy of pulmonary ventilation, and                         response to care were monitored throughout the                         procedure. The physical status of the patient was                         re-assessed after the procedure.                        After obtaining informed consent, the colonoscope was                         passed under direct vision. Throughout the procedure,                         the patient's blood pressure, pulse, and oxygen  saturations were monitored continuously. The                         Colonoscope was introduced through the anus and                         advanced to the the cecum, identified by appendiceal                         orifice and ileocecal valve. The colonoscopy was                         performed without difficulty. The patient tolerated                         the procedure well. The quality of the bowel                         preparation was evaluated using the BBPS [facility] Bowel                         Preparation Scale) with scores of: Right Colon = 3,                         Transverse Colon = 3 and Left Colon = 3  (entire mucosa                         seen well with no residual staining, small fragments                         of stool or opaque liquid). The total BBPS score                         equals 9. The ileocecal valve, appendiceal orifice,                         and rectum were photographed. Findings:      The perianal and digital rectal examinations were normal. Pertinent       negatives include normal sphincter tone and no palpable rectal lesions.      Multiple small-mouthed diverticula were found in the sigmoid colon.      The retroflexed view of the distal rectum and anal verge was normal and       showed no anal or rectal abnormalities.      The exam was otherwise without abnormality. Impression:            - Diverticulosis in the sigmoid colon.                        - The distal rectum and anal verge are normal on                         retroflexion view.                        - The examination was otherwise normal.                        -  No specimens collected. Recommendation:        - Discharge patient to home (with escort).                        - Resume previous diet today.                        - Continue present medications.                        - Repeat colonoscopy in 10 years for screening                         purposes. Procedure Code(s):     --- Professional ---                        Z6109, Colorectal cancer screening; colonoscopy on                         individual at high risk Diagnosis Code(s):     --- Professional ---                        Z86.010, Personal history of colonic polyps                        K57.30, Diverticulosis of large intestine without                         perforation or abscess without bleeding CPT copyright 2022 [employer]. All rights reserved. The codes documented in this report are preliminary and upon coder review may  be revised to meet current compliance requirements. Dr. Libby Maw Toney Reil MD, MD 06/17/2023 8:51:40 AM This report has been signed electronically. Number of Addenda: 0 Note Initiated On: 06/17/2023 8:27 AM Scope Withdrawal Time: 0 hours 8 minutes 11 seconds  Total Procedure Duration: 0 hours 11 minutes 7 seconds  Estimated Blood Loss:  Estimated blood loss: none.      [facility]

## 2023-06-18 ENCOUNTER — Other Ambulatory Visit (INDEPENDENT_AMBULATORY_CARE_PROVIDER_SITE_OTHER)

## 2023-06-18 ENCOUNTER — Encounter: Payer: Self-pay | Admitting: Gastroenterology

## 2023-06-18 DIAGNOSIS — E291 Testicular hypofunction: Secondary | ICD-10-CM | POA: Diagnosis not present

## 2023-06-18 NOTE — Addendum Note (Signed)
 Addended by: Alvina Chou on: 06/18/2023 07:39 AM   Modules accepted: Orders

## 2023-06-19 LAB — CBC WITH DIFFERENTIAL/PLATELET
Basophils Absolute: 0 10*3/uL (ref 0.0–0.2)
Basos: 1 %
EOS (ABSOLUTE): 0.1 10*3/uL (ref 0.0–0.4)
Eos: 2 %
Hematocrit: 51.7 % — ABNORMAL HIGH (ref 37.5–51.0)
Hemoglobin: 17 g/dL (ref 13.0–17.7)
Immature Grans (Abs): 0 10*3/uL (ref 0.0–0.1)
Immature Granulocytes: 0 %
Lymphocytes Absolute: 1.4 10*3/uL (ref 0.7–3.1)
Lymphs: 26 %
MCH: 30.3 pg (ref 26.6–33.0)
MCHC: 32.9 g/dL (ref 31.5–35.7)
MCV: 92 fL (ref 79–97)
Monocytes Absolute: 0.4 10*3/uL (ref 0.1–0.9)
Monocytes: 8 %
Neutrophils Absolute: 3.4 10*3/uL (ref 1.4–7.0)
Neutrophils: 63 %
Platelets: 202 10*3/uL (ref 150–450)
RBC: 5.61 x10E6/uL (ref 4.14–5.80)
RDW: 13.2 % (ref 11.6–15.4)
WBC: 5.3 10*3/uL (ref 3.4–10.8)

## 2023-06-19 LAB — HEPATIC FUNCTION PANEL
ALT: 32 IU/L (ref 0–44)
AST: 28 IU/L (ref 0–40)
Albumin: 4.2 g/dL (ref 3.8–4.9)
Alkaline Phosphatase: 110 IU/L (ref 44–121)
Bilirubin Total: 0.4 mg/dL (ref 0.0–1.2)
Bilirubin, Direct: 0.13 mg/dL (ref 0.00–0.40)
Total Protein: 7.3 g/dL (ref 6.0–8.5)

## 2023-06-19 LAB — TESTOSTERONE: Testosterone: 232 ng/dL — ABNORMAL LOW (ref 264–916)

## 2023-06-24 ENCOUNTER — Other Ambulatory Visit: Payer: Self-pay | Admitting: Family Medicine

## 2023-06-24 ENCOUNTER — Encounter: Payer: Self-pay | Admitting: Family Medicine

## 2023-06-24 DIAGNOSIS — E291 Testicular hypofunction: Secondary | ICD-10-CM

## 2023-06-24 MED ORDER — TESTOSTERONE 20.25 MG/ACT (1.62%) TD GEL: 2.0000 | Freq: Every day | TRANSDERMAL | 3 refills | Status: AC

## 2023-07-02 ENCOUNTER — Ambulatory Visit
Admission: RE | Admit: 2023-07-02 | Discharge: 2023-07-02 | Disposition: A | Discharge status: Home / Self Care | Source: Ambulatory Visit | Attending: Family Medicine | Admitting: Family Medicine

## 2023-07-02 DIAGNOSIS — N289 Disorder of kidney and ureter, unspecified: Secondary | ICD-10-CM | POA: Diagnosis present

## 2023-07-02 DIAGNOSIS — N2889 Other specified disorders of kidney and ureter: Secondary | ICD-10-CM

## 2023-07-02 MED ORDER — GADOBUTROL 1 MMOL/ML IV SOLN
10.0000 mL | Freq: Once | INTRAVENOUS | Status: AC | PRN
  Administered 2023-07-02: 10 mL via INTRAVENOUS

## 2023-07-08 ENCOUNTER — Encounter: Payer: Self-pay | Admitting: Family Medicine

## 2023-09-16 ENCOUNTER — Encounter: Payer: Self-pay | Admitting: Family Medicine

## 2023-09-16 DIAGNOSIS — F988 Other specified behavioral and emotional disorders with onset usually occurring in childhood and adolescence: Secondary | ICD-10-CM

## 2023-09-16 NOTE — Telephone Encounter (Signed)
 Name of Medication: Methylphenidate  CR 27 mg Name of Pharmacy: [facility] [address] Last Ermelinda Hazard or Written Date and Quantity: 08/04/23, #31 Last Office Visit and Type: 05/21/23, lab review Next Office Visit and Type:  11/19/23, 6 mo testosterone  f/u Last Controlled Substance Agreement Date: 05/24/17 Last UDS: 02/07/19

## 2023-09-20 MED ORDER — METHYLPHENIDATE HCL ER (OSM) 27 MG PO TBCR
27.0000 mg | EXTENDED_RELEASE_TABLET | ORAL | 0 refills | Status: DC
Start: 2023-09-20 — End: 2023-10-21

## 2023-09-20 NOTE — Telephone Encounter (Signed)
 ERx

## 2023-10-21 MED ORDER — METHYLPHENIDATE HCL ER (OSM) 27 MG PO TBCR: 27.0000 mg | EXTENDED_RELEASE_TABLET | ORAL | 0 refills | Status: DC

## 2023-10-21 NOTE — Telephone Encounter (Signed)
 Name of Medication:  Methylphenidate  CR 27 mg Name of Pharmacy:  [facility] [address] Last Kandra or Written Date and Quantity:  09/20/23, #31 Last Office Visit and Type:  05/21/23, lab review Next Office Visit and Type:  11/19/23, 6 mo testosterone  f/u Last Controlled Substance Agreement Date:  05/24/17 Last UDS:  02/07/19

## 2023-10-21 NOTE — Addendum Note (Signed)
 Addended by: Derril Franek on: 10/21/2023 08:23 AM   Modules accepted: Orders

## 2023-10-21 NOTE — Telephone Encounter (Signed)
 ERx

## 2023-11-03 ENCOUNTER — Encounter: Payer: Self-pay | Admitting: Family Medicine

## 2023-11-03 DIAGNOSIS — Z7189 Other specified counseling: Secondary | ICD-10-CM | POA: Insufficient documentation

## 2023-11-03 NOTE — Assessment & Plan Note (Signed)
 Advanced directive: reviewed, scanned into chart 10/2023. Wife Sari then Alm Sherwood Bathe are HCPOA. Does not want life prolonging measures if irreversible terminal condition.

## 2023-11-17 ENCOUNTER — Encounter: Payer: Self-pay | Admitting: Family Medicine

## 2023-11-17 DIAGNOSIS — F988 Other specified behavioral and emotional disorders with onset usually occurring in childhood and adolescence: Secondary | ICD-10-CM

## 2023-11-17 NOTE — Telephone Encounter (Signed)
 Name of Medication: Methylphenidate  CR 27 mg Name of Pharmacy: [facility] [address] Last Kandra or Written Date and Quantity: 10/21/23, #31 Last Office Visit and Type:  05/21/23, lab rev Next Office Visit and Type:  none Last Controlled Substance Agreement Date: 05/24/17 Last UDS: 02/07/19

## 2023-11-19 ENCOUNTER — Ambulatory Visit: Admitting: Family Medicine

## 2023-11-19 MED ORDER — METHYLPHENIDATE HCL ER (OSM) 27 MG PO TBCR: 27.0000 mg | EXTENDED_RELEASE_TABLET | ORAL | 0 refills | Status: DC

## 2023-11-19 MED ORDER — METHYLPHENIDATE HCL ER (OSM) 27 MG PO TBCR
27.0000 mg | EXTENDED_RELEASE_TABLET | ORAL | 0 refills | Status: DC
Start: 2023-11-19 — End: 2024-02-22

## 2023-11-19 MED ORDER — METHYLPHENIDATE HCL ER (OSM) 27 MG PO TBCR
27.0000 mg | EXTENDED_RELEASE_TABLET | ORAL | 0 refills | Status: DC
Start: 2023-12-20 — End: 2024-02-22

## 2023-12-21 ENCOUNTER — Encounter: Payer: Self-pay | Admitting: Family Medicine

## 2024-02-21 ENCOUNTER — Encounter: Payer: Self-pay | Admitting: Family Medicine

## 2024-02-21 DIAGNOSIS — F988 Other specified behavioral and emotional disorders with onset usually occurring in childhood and adolescence: Secondary | ICD-10-CM

## 2024-02-21 NOTE — Telephone Encounter (Signed)
 Pt has 3 methylphenidate  rxs with following fill dates: 11/29/23, #31/0; 12/20/23, #31/0; 01/19/24, #31/0 [facility] [address].

## 2024-02-22 MED ORDER — METHYLPHENIDATE HCL ER (OSM) 27 MG PO TBCR: 27.0000 mg | EXTENDED_RELEASE_TABLET | ORAL | 0 refills | Status: DC

## 2024-02-22 NOTE — Addendum Note (Signed)
 Addended by: RILLA BALLER on: 02/22/2024 05:34 PM   Modules accepted: Orders

## 2024-02-22 NOTE — Telephone Encounter (Signed)
 ERx

## 2024-04-24 ENCOUNTER — Encounter: Payer: Self-pay | Admitting: Family Medicine

## 2024-04-24 DIAGNOSIS — F988 Other specified behavioral and emotional disorders with onset usually occurring in childhood and adolescence: Secondary | ICD-10-CM

## 2024-04-24 NOTE — Telephone Encounter (Signed)
 Requesting: Methylphenidate   Contract: No UDS:  Last Visit: 05/21/2023 Next Visit: Visit date not found Last Refill: 3 scripts given 01/19/24  Please Advise

## 2024-04-26 ENCOUNTER — Encounter: Payer: Self-pay | Admitting: Family Medicine

## 2024-04-26 MED ORDER — METHYLPHENIDATE HCL ER (OSM) 27 MG PO TBCR: 27.0000 mg | EXTENDED_RELEASE_TABLET | ORAL | 0 refills | Status: AC

## 2024-04-26 NOTE — Telephone Encounter (Signed)
 Rxs [x3] sent to [facility] [address] and per Dr KANDICE, pt needs to Also, he states you need to schedule your physical after 05/20/2024 (see 04/24/24 pt msg).   Spoke with pt relaying info above. Pt verbalizes understanding, expresses his thanks and schedule CPE on 06/16/24 at 12:00 and fasting CPE lab appt on 06/09/24 at 8:45.

## 2024-04-26 NOTE — Telephone Encounter (Signed)
 ERx Please schedule CPE after 05/20/2024

## 2024-06-09 ENCOUNTER — Other Ambulatory Visit

## 2024-06-16 ENCOUNTER — Encounter: Admitting: Family Medicine
